# Patient Record
Sex: Female | Born: 1985 | Race: Black or African American | Hispanic: No | Marital: Single | Smoking: Never smoker
Health system: Southern US, Community
[De-identification: ages and names within clinical notes are randomized; demographics above are authoritative.]

## PROBLEM LIST (undated history)

## (undated) DIAGNOSIS — D219 Benign neoplasm of connective and other soft tissue, unspecified: Secondary | ICD-10-CM

## (undated) DIAGNOSIS — R87629 Unspecified abnormal cytological findings in specimens from vagina: Secondary | ICD-10-CM

## (undated) DIAGNOSIS — D649 Anemia, unspecified: Secondary | ICD-10-CM

## (undated) HISTORY — PX: NO PAST SURGERIES: SHX2092

---

## 2008-01-10 ENCOUNTER — Ambulatory Visit (HOSPITAL_COMMUNITY): Admission: RE | Admit: 2008-01-10 | Discharge: 2008-01-10 | Payer: Self-pay | Admitting: Obstetrics

## 2008-02-12 ENCOUNTER — Ambulatory Visit (HOSPITAL_COMMUNITY): Admission: RE | Admit: 2008-02-12 | Discharge: 2008-02-12 | Payer: Self-pay | Admitting: Obstetrics

## 2008-04-08 ENCOUNTER — Ambulatory Visit (HOSPITAL_COMMUNITY): Admission: RE | Admit: 2008-04-08 | Discharge: 2008-04-08 | Payer: Self-pay | Admitting: Obstetrics

## 2008-05-01 ENCOUNTER — Inpatient Hospital Stay (HOSPITAL_COMMUNITY): Admission: AD | Admit: 2008-05-01 | Discharge: 2008-05-01 | Payer: Self-pay | Admitting: Obstetrics

## 2008-05-06 ENCOUNTER — Ambulatory Visit (HOSPITAL_COMMUNITY): Admission: RE | Admit: 2008-05-06 | Discharge: 2008-05-06 | Payer: Self-pay | Admitting: Obstetrics

## 2008-05-13 ENCOUNTER — Inpatient Hospital Stay (HOSPITAL_COMMUNITY): Admission: AD | Admit: 2008-05-13 | Discharge: 2008-05-15 | Payer: Self-pay | Admitting: Obstetrics

## 2008-05-30 ENCOUNTER — Inpatient Hospital Stay (HOSPITAL_COMMUNITY): Admission: AD | Admit: 2008-05-30 | Discharge: 2008-06-02 | Payer: Self-pay | Admitting: Obstetrics

## 2009-03-22 IMAGING — US US OB FOLLOW-UP
1 series · 14 of 27 positions shown · non-contrast
Comparison: none

OBSTETRICAL ULTRASOUND:
 This ultrasound was performed in The [HOSPITAL], and the [facility] report will be stored to [REDACTED] PACS.

[Series 1: us ob follow-up · 14 of 27 slices shown]
[im 1/27]
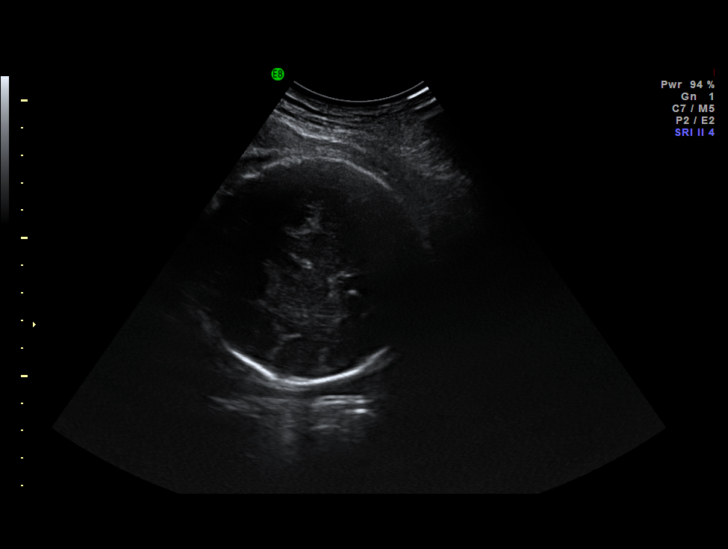
[im 3/27]
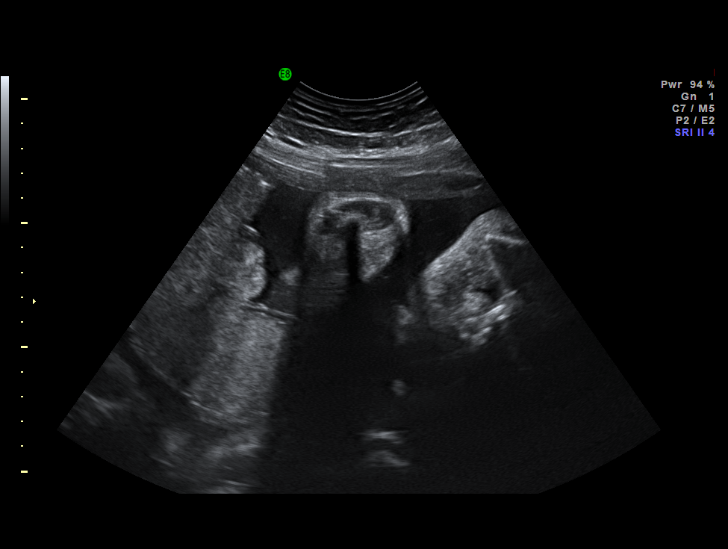
[im 5/27]
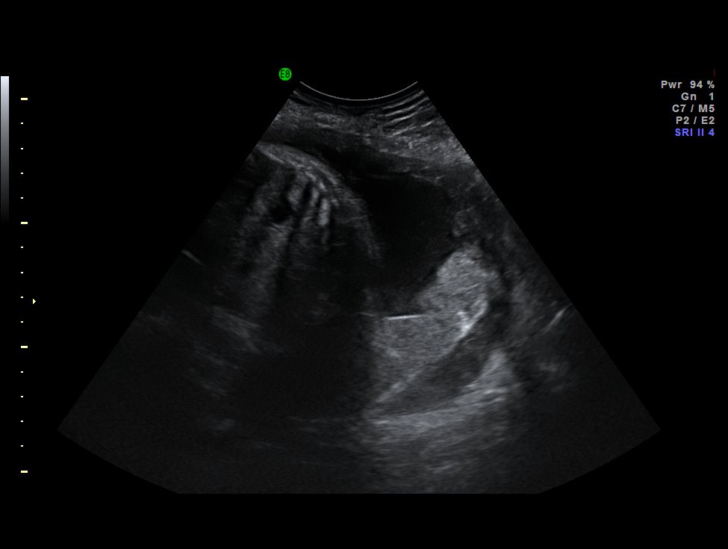
[im 7/27]
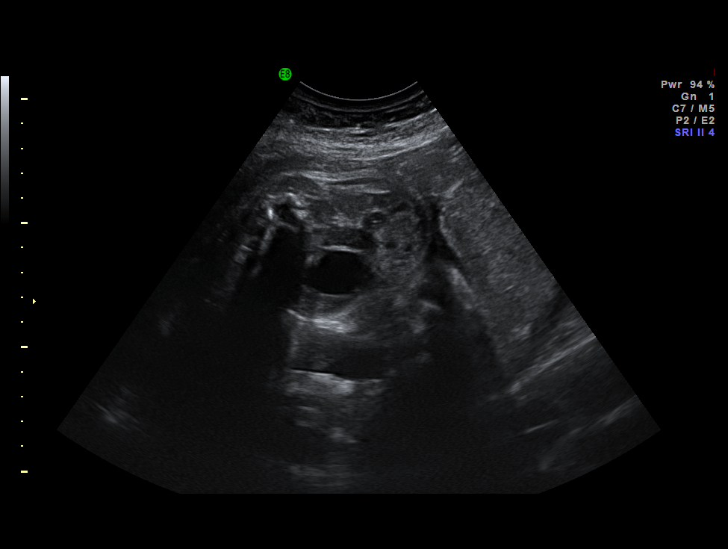
[im 9/27]
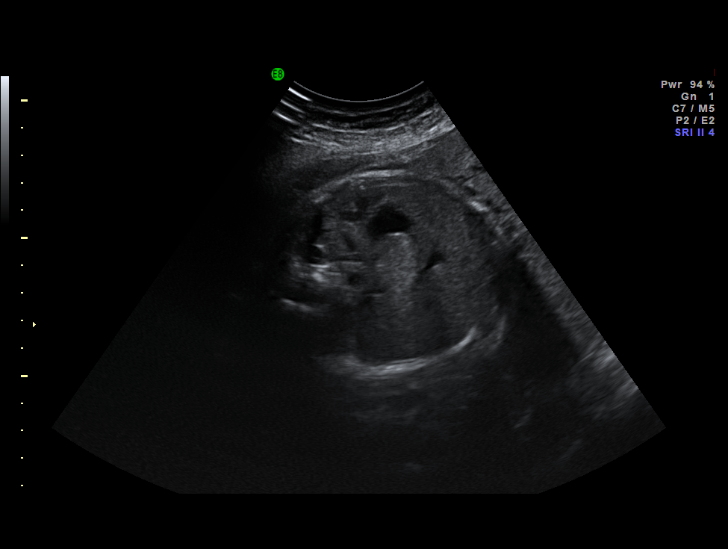
[im 11/27]
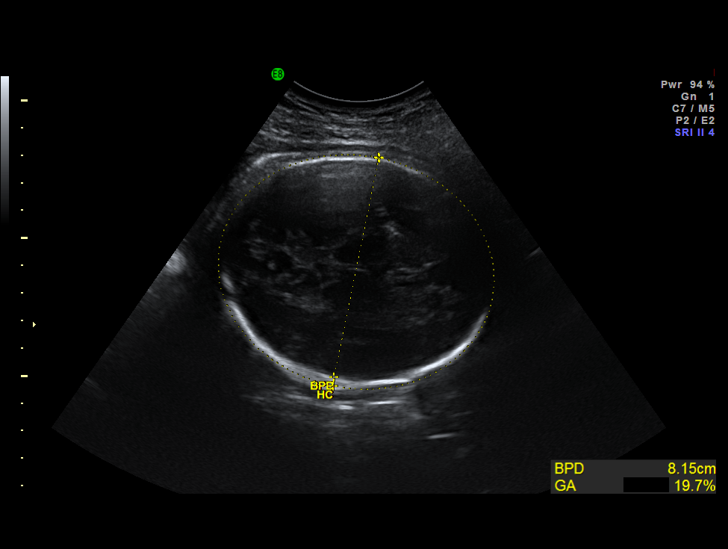
[im 13/27]
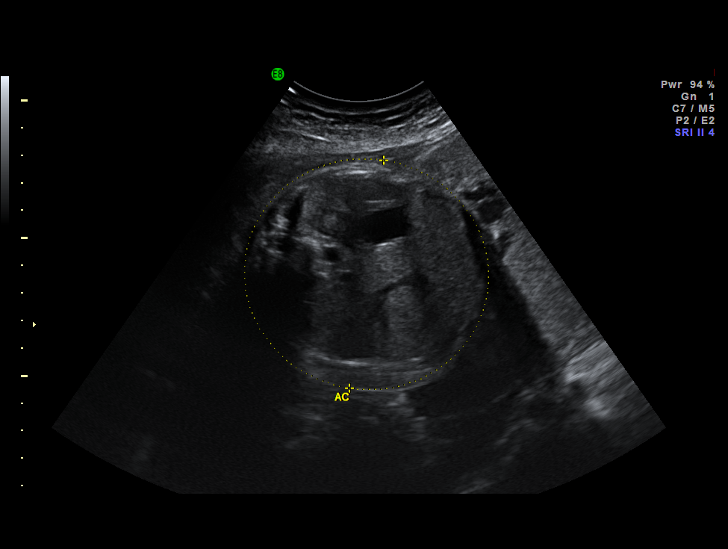
[im 15/27]
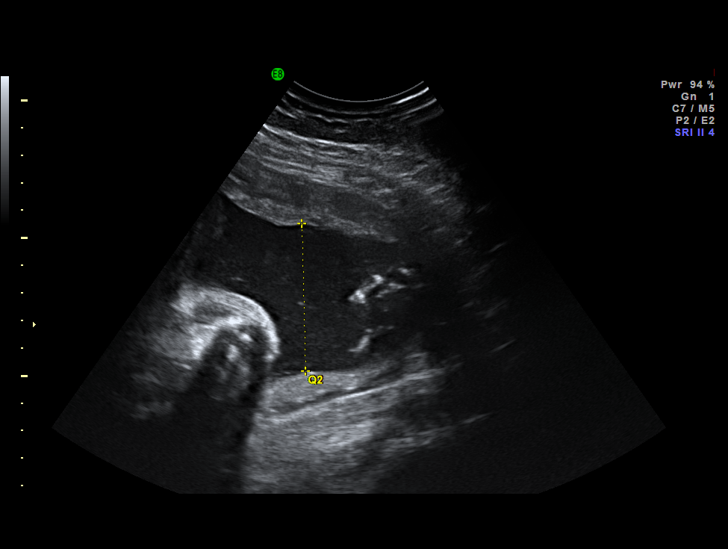
[im 17/27]
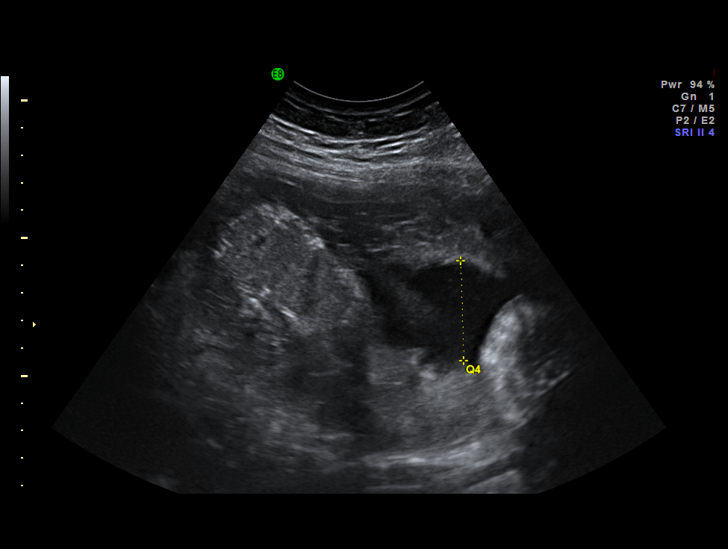
[im 19/27]
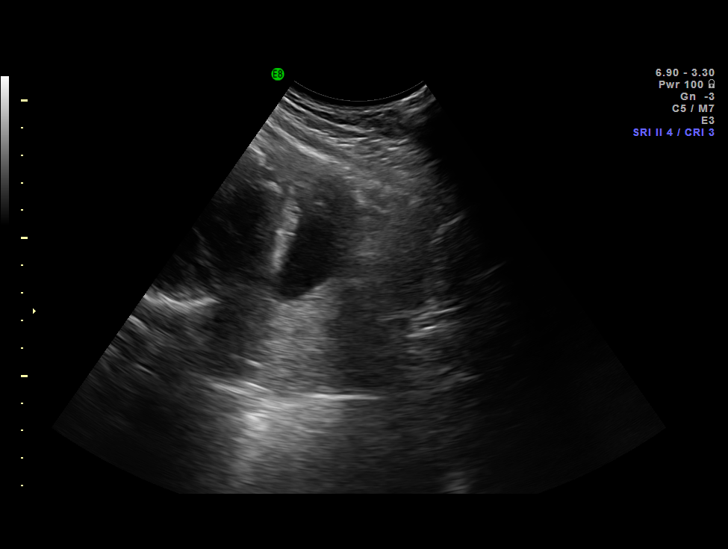
[im 21/27]
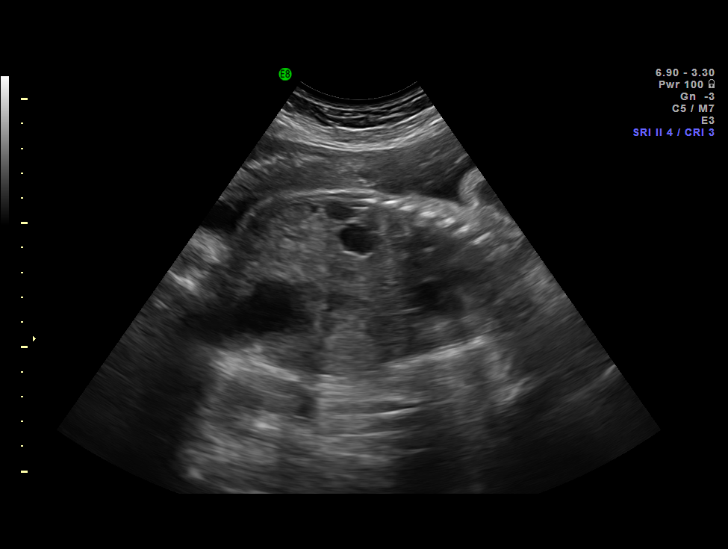
[im 23/27]
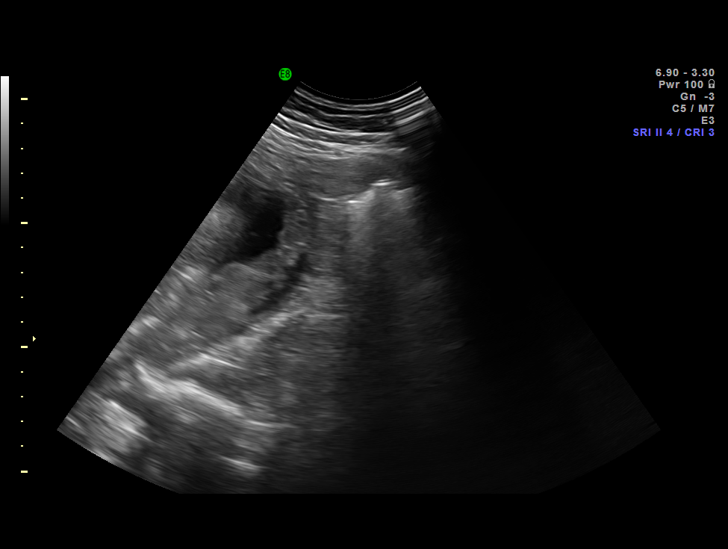
[im 25/27]
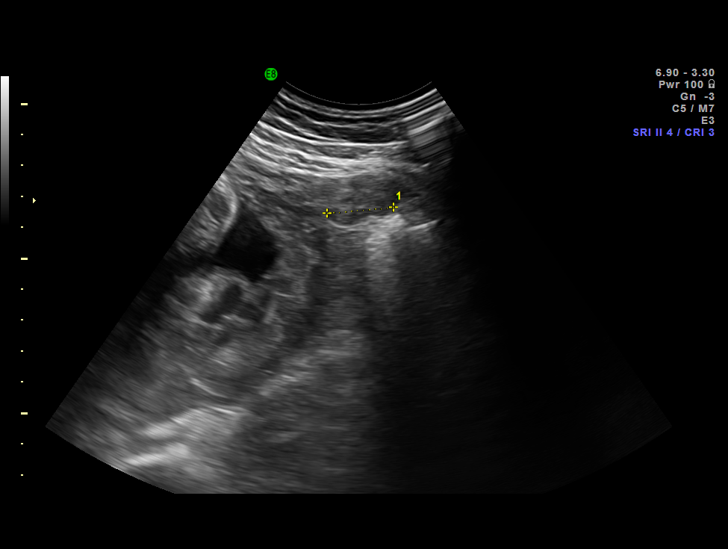
[im 27/27]
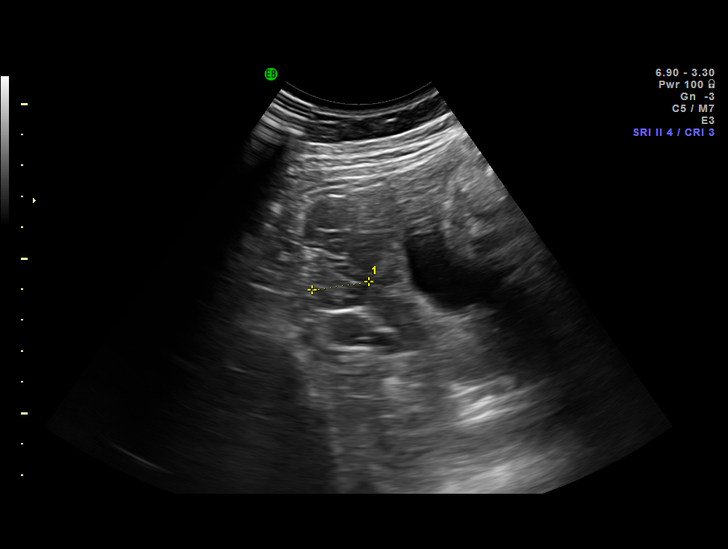

[14 of 27 positions shown; findings below may reference images not displayed]

IMPRESSION: [facility] has also been faxed to the ordering physician.

## 2009-04-04 ENCOUNTER — Emergency Department (HOSPITAL_COMMUNITY): Admission: EM | Admit: 2009-04-04 | Discharge: 2009-04-05 | Payer: Self-pay | Admitting: Emergency Medicine

## 2009-04-19 IMAGING — US US OB FOLLOW-UP
1 series · 14 of 18 positions shown · non-contrast
Comparison: none

OBSTETRICAL ULTRASOUND:
 This ultrasound was performed in The [HOSPITAL], and the [facility] report will be stored to [REDACTED] PACS.

[Series 1: us ob follow-up · 14 of 18 slices shown]
[im 1/18]
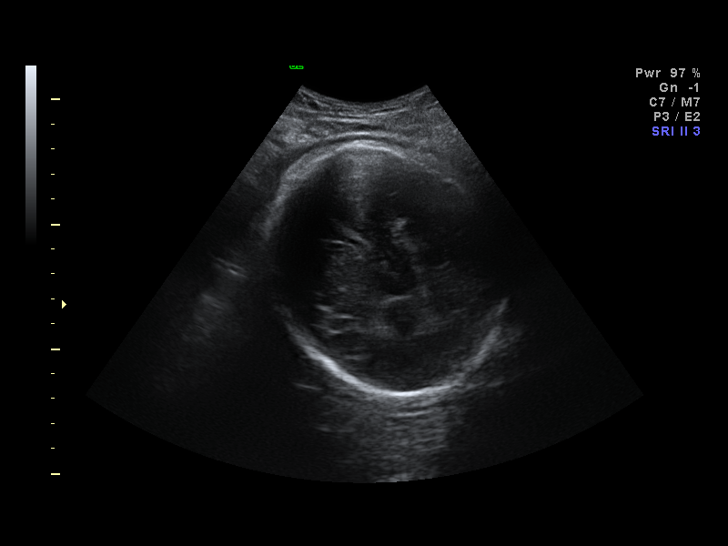
[im 2/18]
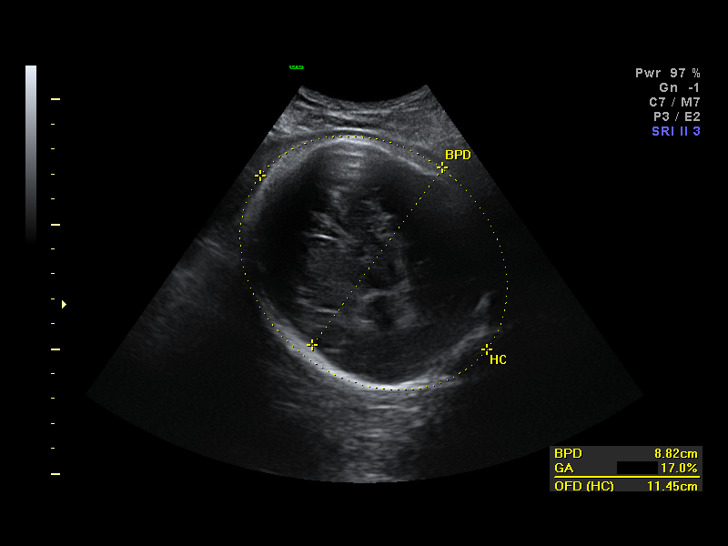
[im 4/18]
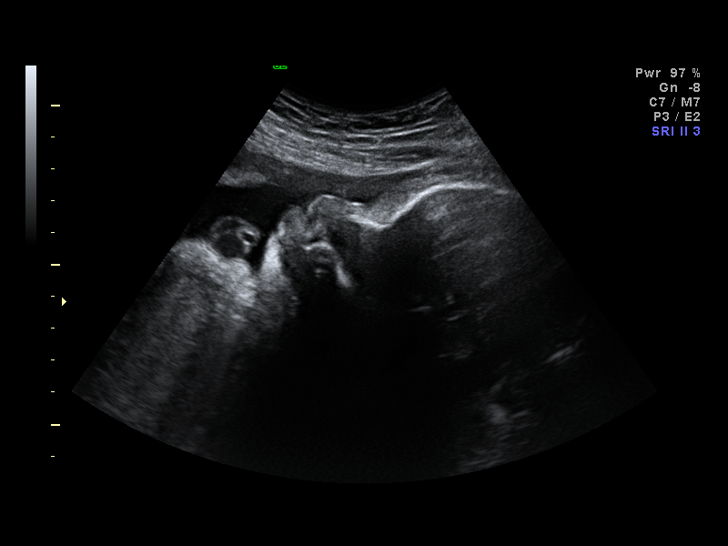
[im 5/18]
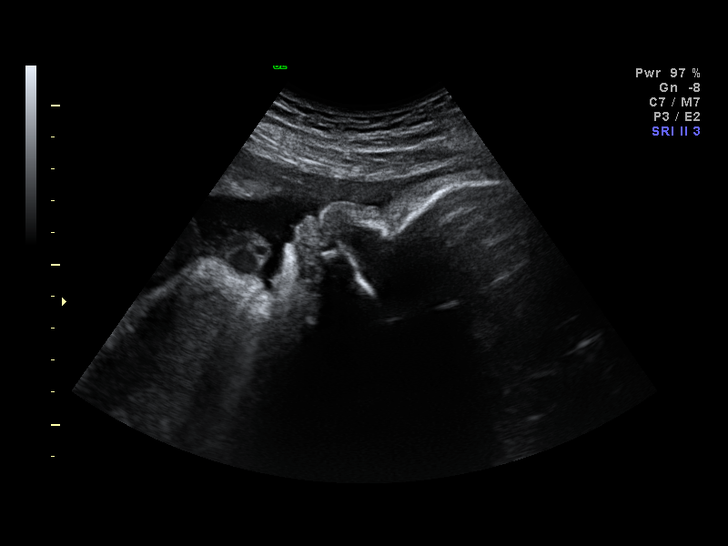
[im 6/18]
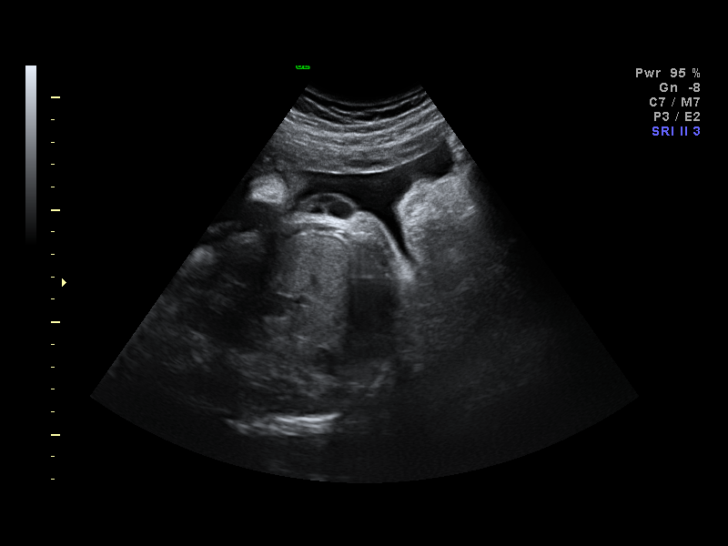
[im 8/18]
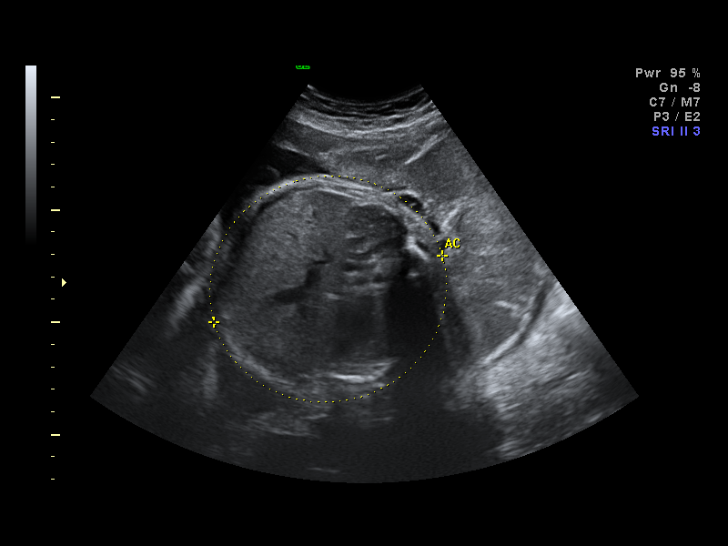
[im 9/18]
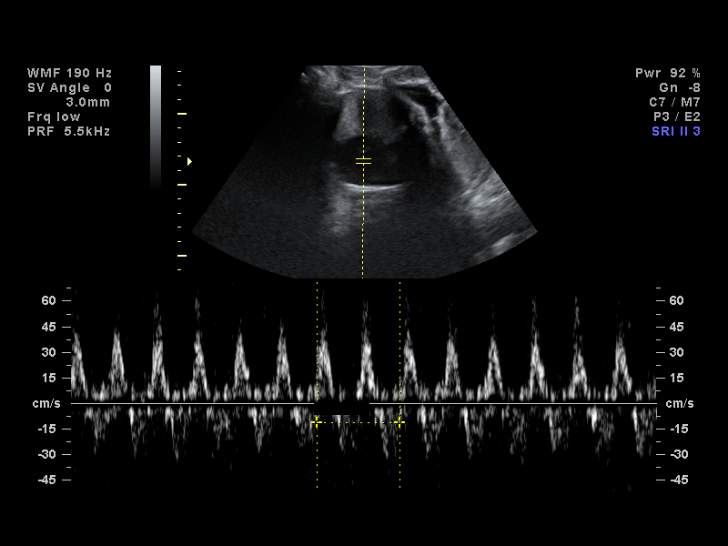
[im 10/18]
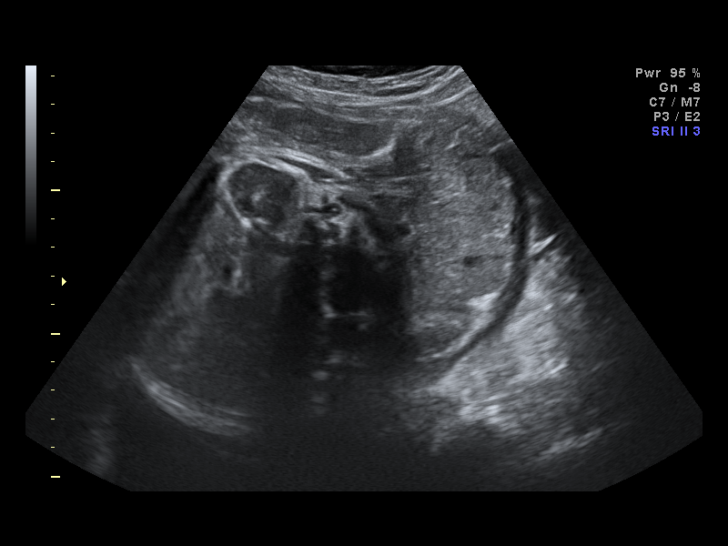
[im 11/18]
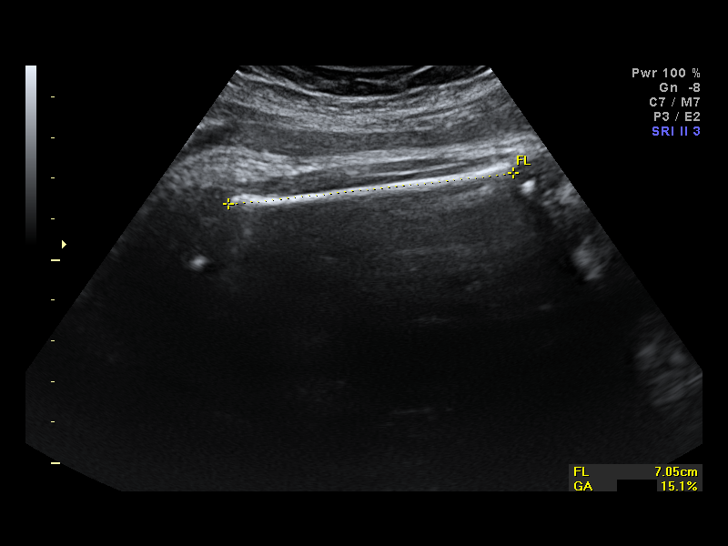
[im 13/18]
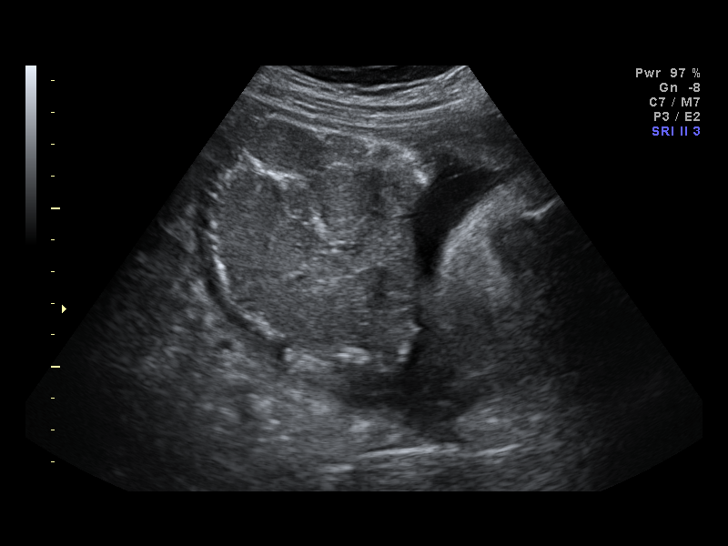
[im 14/18]
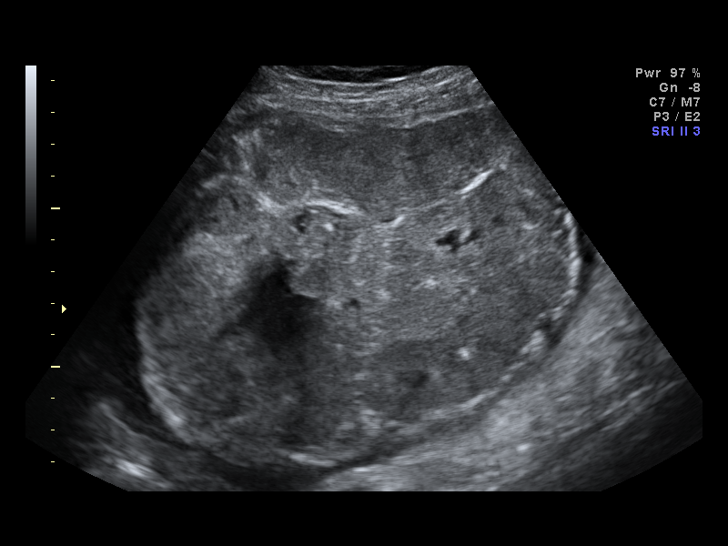
[im 15/18]
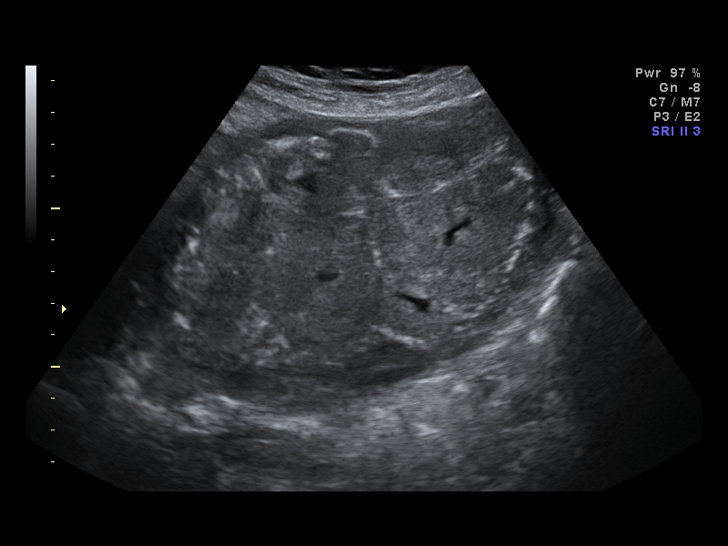
[im 17/18]
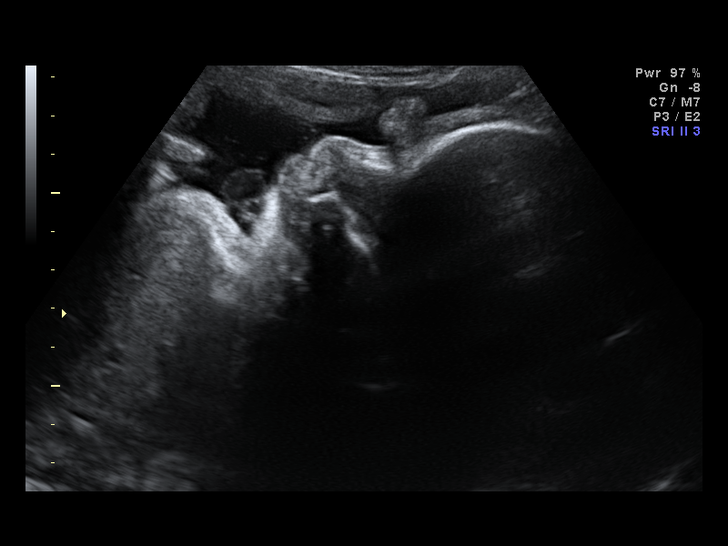
[im 18/18]
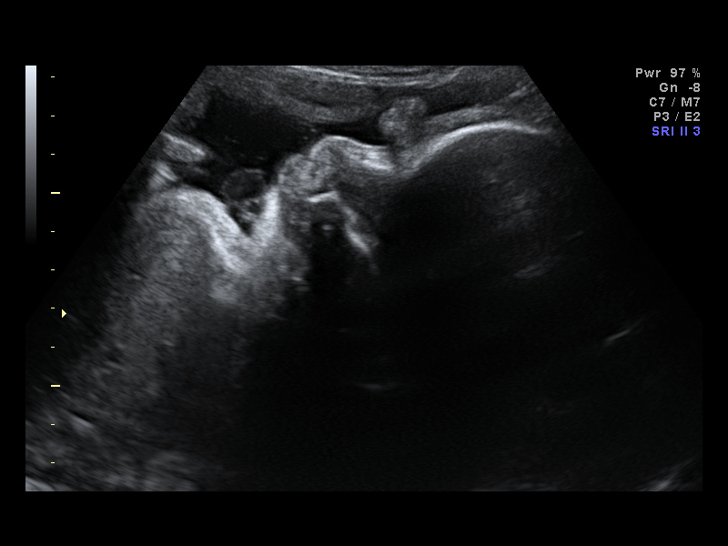

[14 of 18 positions shown; findings below may reference images not displayed]

IMPRESSION: [facility] has also been faxed to the ordering physician.

## 2009-05-14 IMAGING — US US RENAL
1 series · 14 of 25 positions shown · non-contrast
Comparison: None

CLINICAL DATA: Fever, chills, back pain.  Evaluate for
urolithiasis.

RENAL/URINARY TRACT ULTRASOUND
TECHNIQUE: Complete ultrasound examination of the urinary tract
was performed including evaluation of the kidneys renal collecting
systems and urinary bladder.

[Series 1: us renal · 0.28mm/px · 14 of 27 slices shown]
[im 1/27]
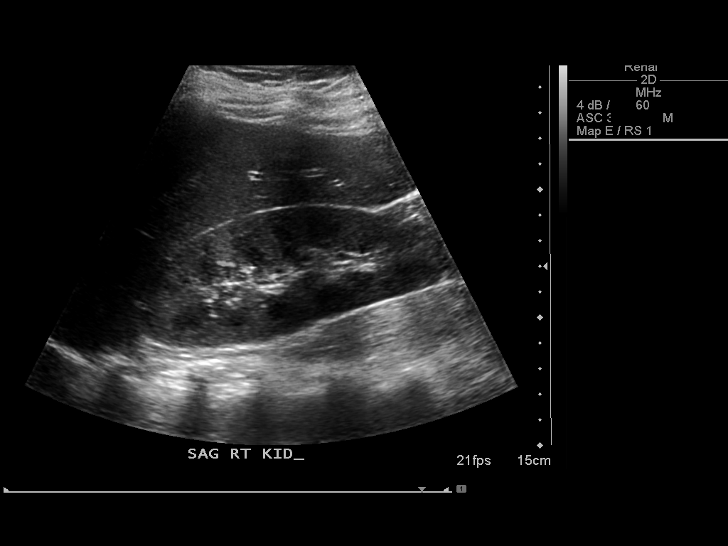
[im 3/27]
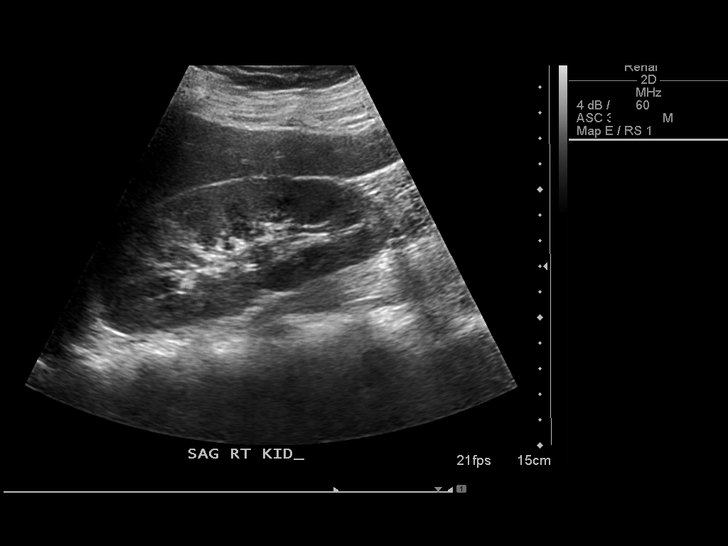
[im 5/27]
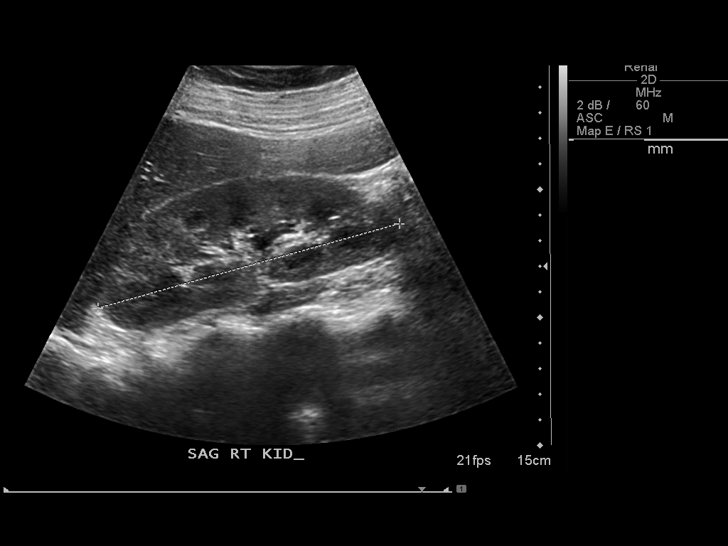
[im 7/27]
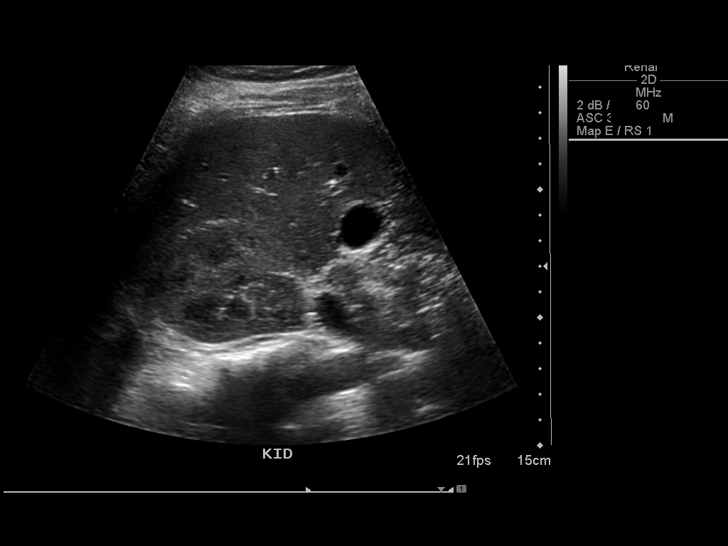
[im 9/27]
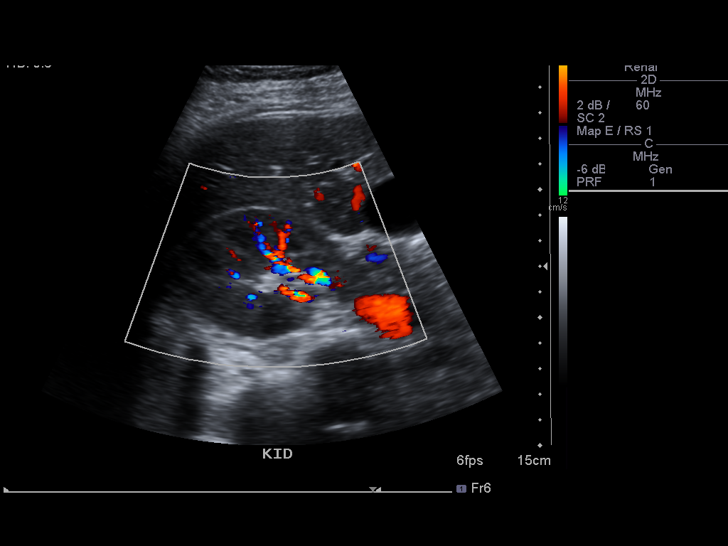
[im 10/27]
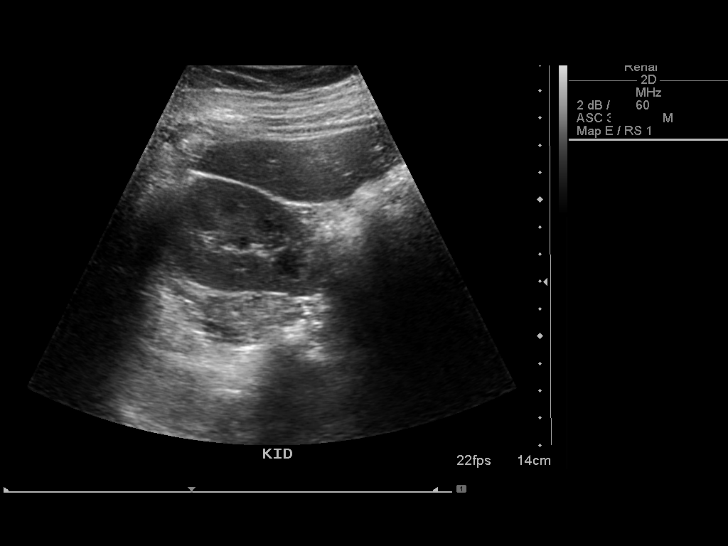
[im 12/27]
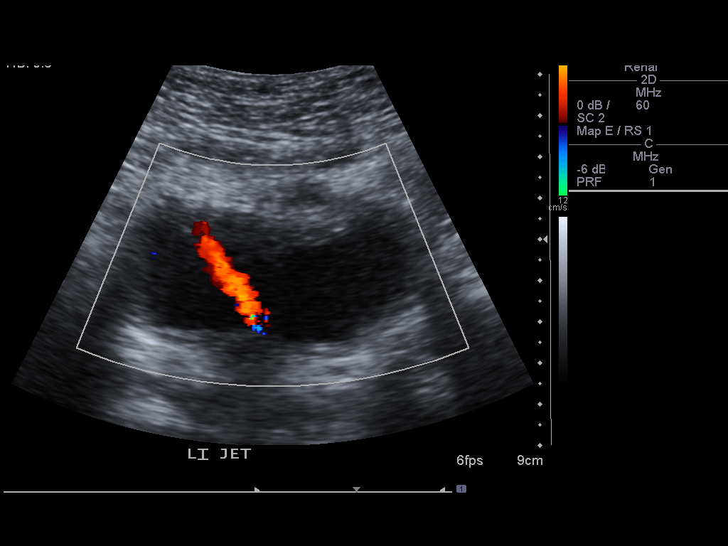
[im 15/27]
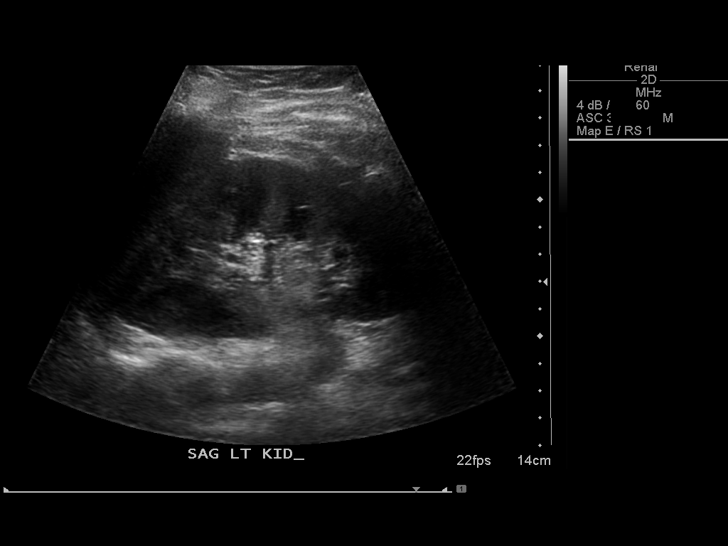
[im 17/27]
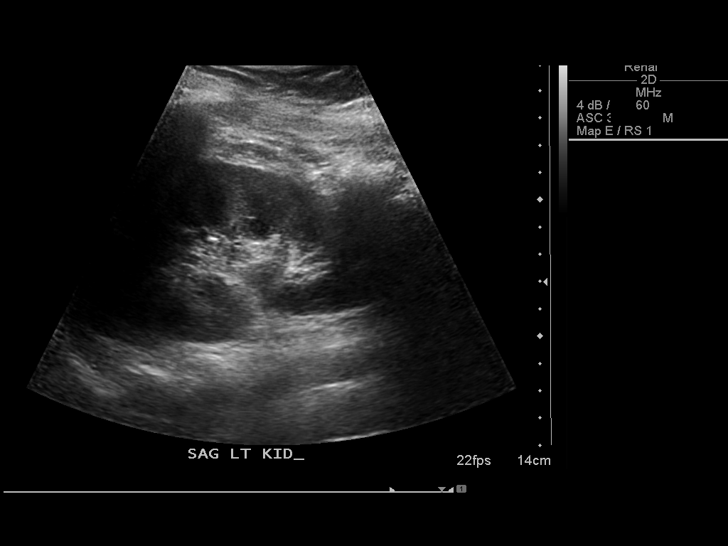
[im 18/27]
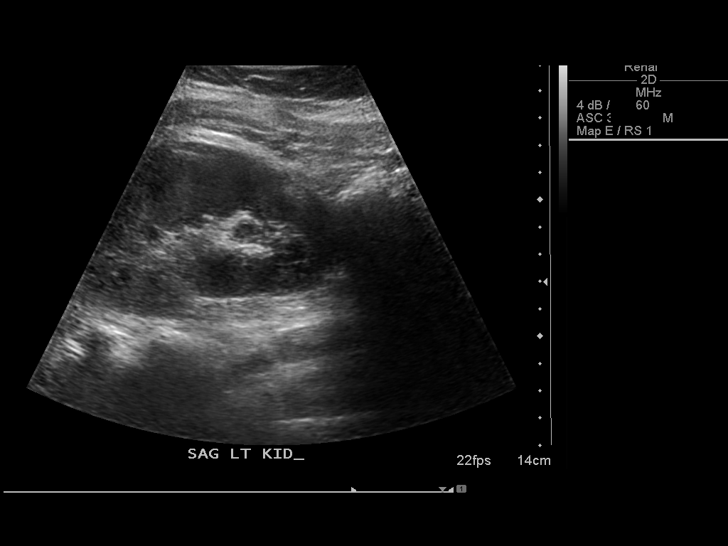
[im 20/27]
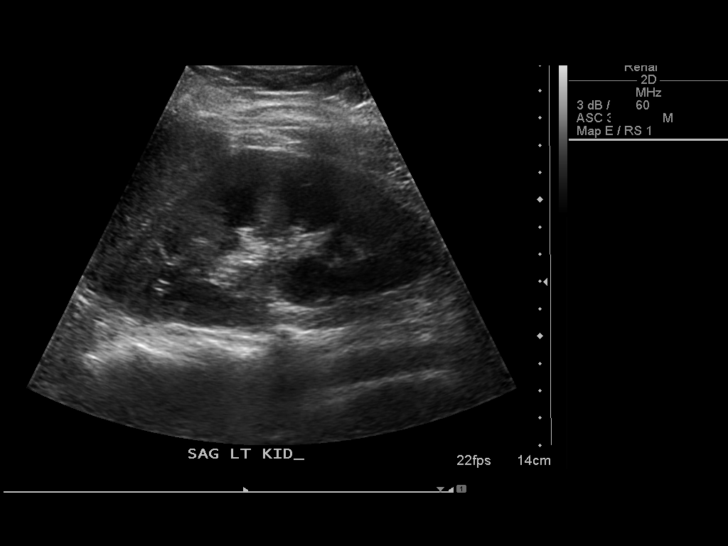
[im 22/27]
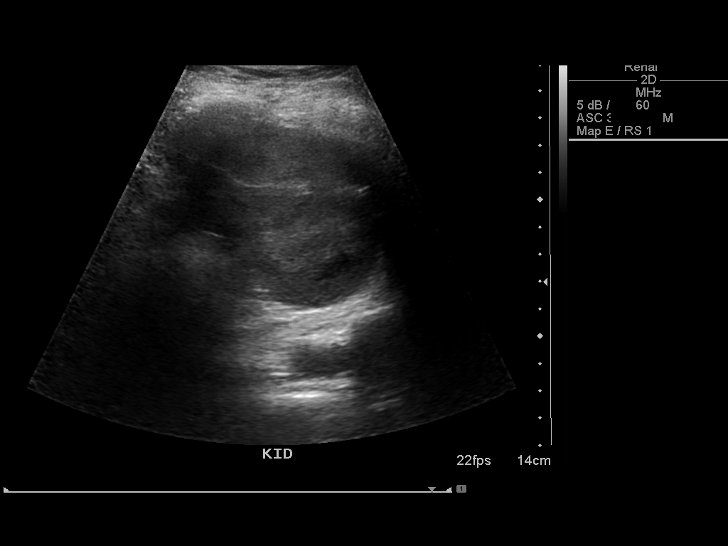
[im 24/27]
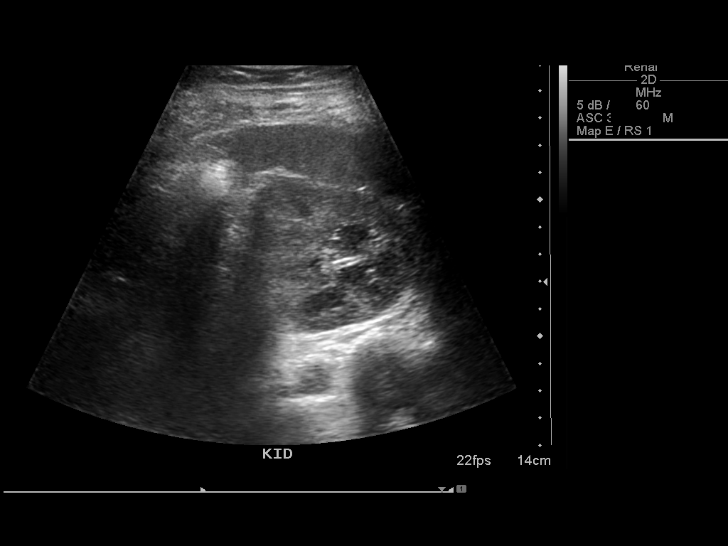
[im 27/27]
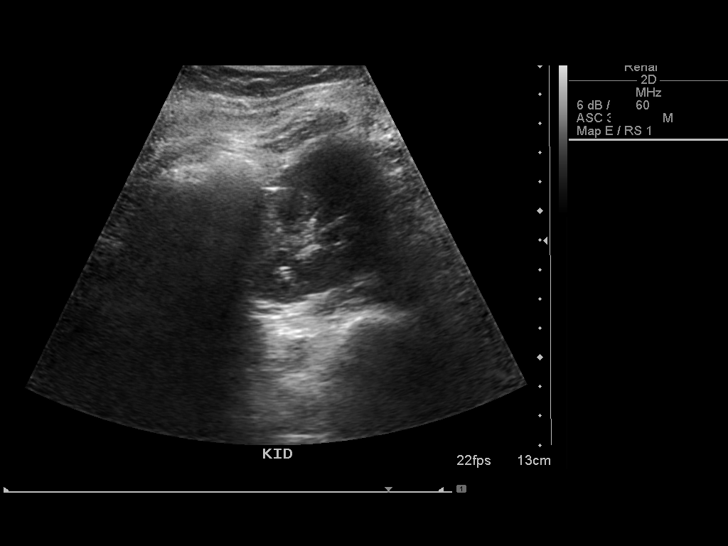

[14 of 25 positions shown; findings below may reference images not displayed]

FINDINGS: Kidneys measure 12.3 cm bilaterally.  Parenchymal echo
texture is uniform without mass, stone or hydronephrosis.  Ureteral
jets are seen in the bladder.  No hydronephrosis.
IMPRESSION: No acute findings.

## 2010-04-29 ENCOUNTER — Emergency Department (HOSPITAL_COMMUNITY): Admission: EM | Admit: 2010-04-29 | Discharge: 2010-04-29 | Payer: Self-pay | Admitting: Emergency Medicine

## 2011-03-15 LAB — POCT URINALYSIS DIP (DEVICE)
Glucose, UA: NEGATIVE mg/dL
Ketones, ur: NEGATIVE mg/dL
Nitrite: NEGATIVE
Protein, ur: NEGATIVE mg/dL
Specific Gravity, Urine: 1.025 (ref 1.005–1.030)

## 2011-03-15 LAB — URINE CULTURE: Colony Count: NO GROWTH

## 2011-03-15 LAB — POCT PREGNANCY, URINE: Preg Test, Ur: NEGATIVE

## 2011-04-12 IMAGING — CR DG CHEST 2V
2 series · 2 of 2 positions shown · non-contrast
Comparison: None.

CLINICAL DATA: Shortness of breath.

CHEST - 2 VIEW

[view not recorded (1 of 2)]
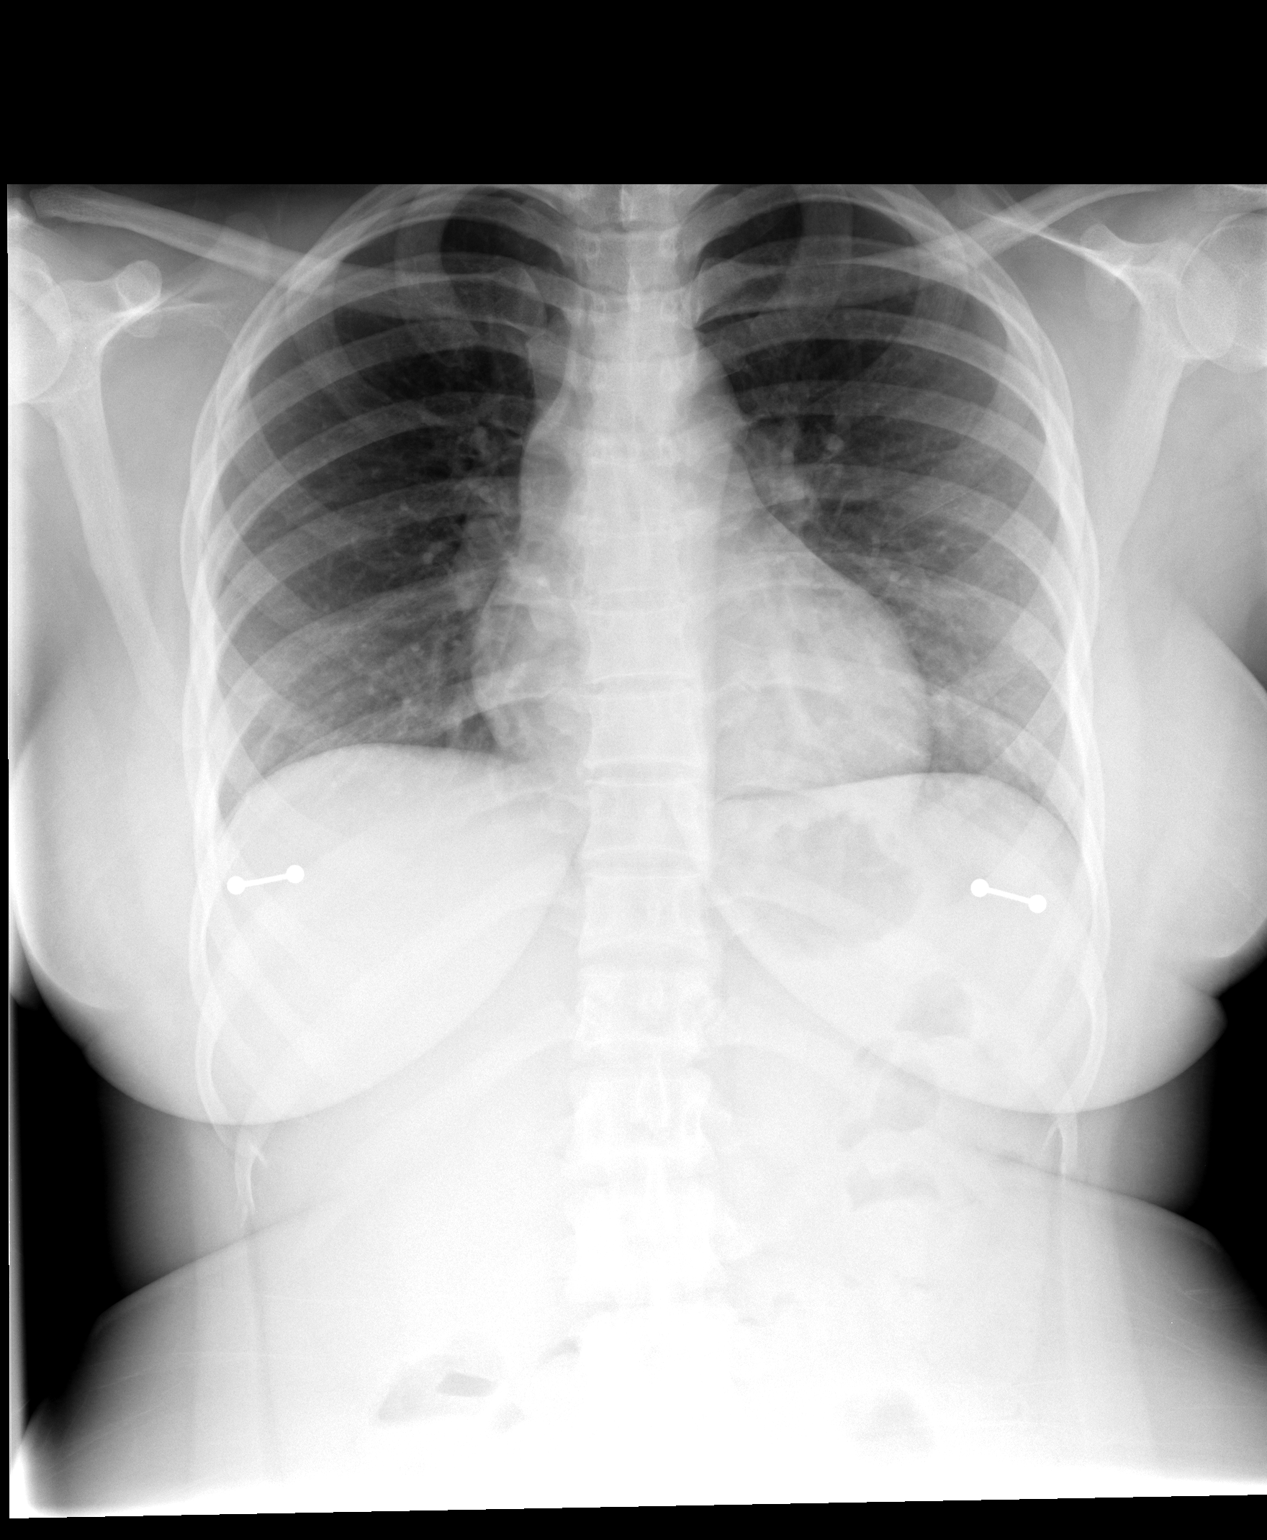

[view not recorded (2 of 2)]
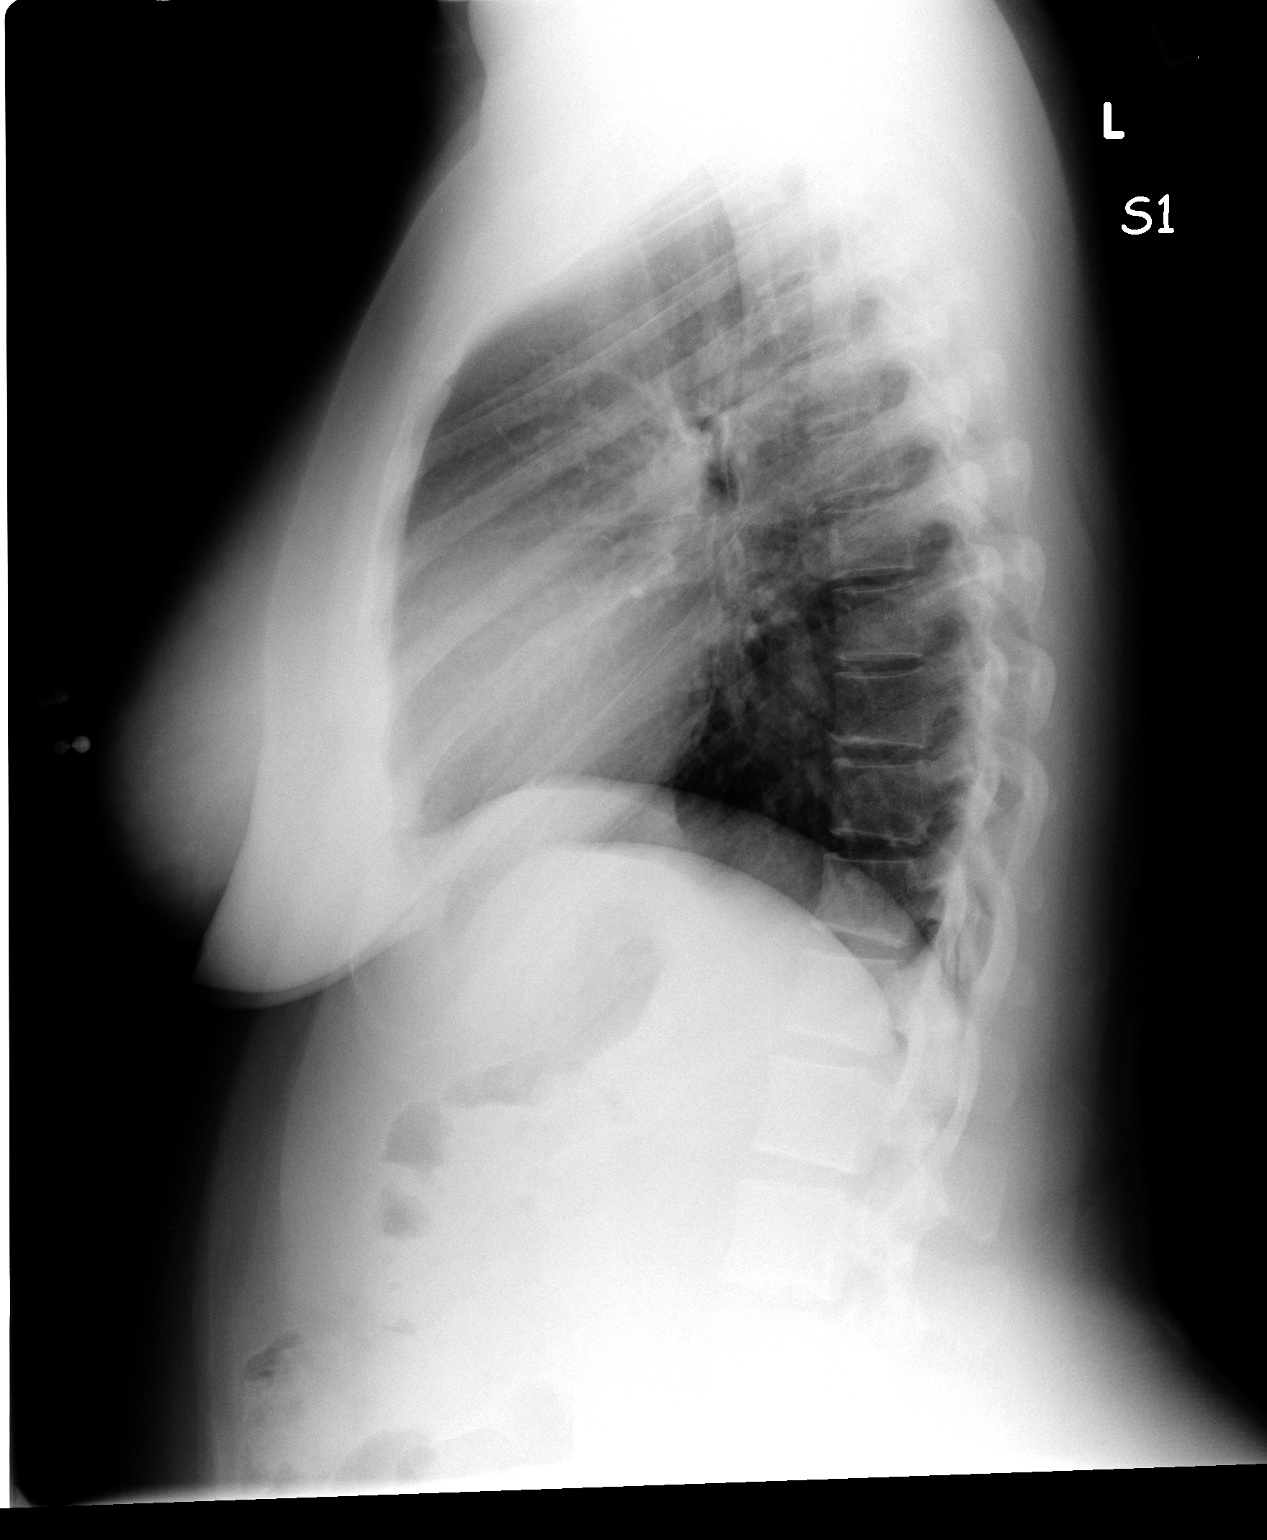

[2 of 2 positions shown; findings below may reference images not displayed]

FINDINGS: Lungs clear.  Heart size normal.  No pleural effusion or
focal bony abnormality.
IMPRESSION: Normal chest.

## 2011-05-13 NOTE — Discharge Summary (Signed)
Kristi Kirby, Kristi Kirby NO.:  [id number]   MEDICAL RECORD NO.:  [id number]          PATIENT TYPE:  INP   LOCATION:  9312                          FACILITY:  [facility]   PHYSICIAN:  Kathreen Cosier, M.D.DATE OF BIRTH:  03-Apr-1986   DATE OF ADMISSION:  05/30/2008  DATE OF DISCHARGE:  06/02/2008                               DISCHARGE SUMMARY   The patient is a 25 year old female who complained of CVA tenderness and  temperature of 102.  She had a vaginal delivery on May 13, 2008, and she  had chills.  She took Motrin and the chills went away, but however, the  chills came back.  On admission, she had, like I said, temperature of  102 and she was started on ampicillin IV and gentamicin and she  responded appropriately.  By June 02, 2008, she was 2 days afebrile and  her CVA tenderness had resolved.  She was discharged home on ampicillin  500 p.o. q.6 h. for 5 days and Yaz for contraception.   DISCHARGE DIAGNOSIS:  Status post admission for pyelonephritis.           ______________________________  Kathreen Cosier, M.D.     BAM/MEDQ  D:  06/18/2008  T:  06/19/2008  Job:  [id number]

## 2011-09-07 ENCOUNTER — Inpatient Hospital Stay (INDEPENDENT_AMBULATORY_CARE_PROVIDER_SITE_OTHER)
Admission: RE | Admit: 2011-09-07 | Discharge: 2011-09-07 | Disposition: A | Discharge status: Home / Self Care | Payer: Self-pay | Source: Ambulatory Visit | Attending: Family Medicine | Admitting: Family Medicine

## 2011-09-07 DIAGNOSIS — L259 Unspecified contact dermatitis, unspecified cause: Secondary | ICD-10-CM

## 2011-09-21 LAB — CBC
HCT: 35.5 — ABNORMAL LOW
HCT: 38.5
Hemoglobin: 12.2
MCV: 94.7
MCV: 95.1
Platelets: 229
WBC: 15.6 — ABNORMAL HIGH

## 2011-09-22 LAB — URINALYSIS, ROUTINE W REFLEX MICROSCOPIC
Nitrite: NEGATIVE
Specific Gravity, Urine: 1.01
Urobilinogen, UA: 0.2
pH: 6

## 2011-09-22 LAB — CULTURE, BLOOD (ROUTINE X 2)

## 2011-09-22 LAB — CBC
Hemoglobin: 13.5
MCHC: 34.6
RBC: 4.12
RDW: 14

## 2011-09-22 LAB — DIFFERENTIAL
Basophils Absolute: 0
Eosinophils Absolute: 0
Lymphocytes Relative: 8 — ABNORMAL LOW
Lymphs Abs: 0.9
Monocytes Absolute: 0.6
Monocytes Relative: 5
Neutro Abs: 10.6 — ABNORMAL HIGH
Neutrophils Relative %: 87 — ABNORMAL HIGH

## 2011-09-22 LAB — URINE MICROSCOPIC-ADD ON

## 2011-09-22 LAB — URINE CULTURE

## 2011-09-22 LAB — CREATININE, SERUM
GFR calc Af Amer: >60
GFR calc non Af Amer: >60

## 2014-01-01 ENCOUNTER — Emergency Department (INDEPENDENT_AMBULATORY_CARE_PROVIDER_SITE_OTHER)
Admission: EM | Admit: 2014-01-01 | Discharge: 2014-01-01 | Disposition: A | Discharge status: Home / Self Care | Payer: Self-pay | Source: Home / Self Care | Attending: Emergency Medicine | Admitting: Emergency Medicine

## 2014-01-01 ENCOUNTER — Encounter (HOSPITAL_COMMUNITY): Payer: Self-pay | Admitting: Emergency Medicine

## 2014-01-01 DIAGNOSIS — R69 Illness, unspecified: Principal | ICD-10-CM

## 2014-01-01 DIAGNOSIS — J111 Influenza due to unidentified influenza virus with other respiratory manifestations: Secondary | ICD-10-CM

## 2014-01-01 MED ORDER — IBUPROFEN 800 MG PO TABS
800.0000 mg | ORAL_TABLET | Freq: Once | ORAL | Status: AC
  Administered 2014-01-01: 800 mg via ORAL

## 2014-01-01 MED ORDER — OSELTAMIVIR PHOSPHATE 75 MG PO CAPS: 75.0000 mg | ORAL_CAPSULE | Freq: Two times a day (BID) | ORAL | Status: DC

## 2014-01-01 NOTE — ED Notes (Signed)
Pt  Has  Symptoms  Of  Body  Aches   Chills  Headache      With  Sinus  Pressure     /  Drainage   X    2  Days    She  Is  Sitting upright  On  Exam table  Speaking in  Complete  sentances             No  Antipyretics  Given today

## 2014-01-01 NOTE — ED Provider Notes (Signed)
CSN: [id number]     Arrival date & time 01/01/14  1506 History   First MD Initiated Contact with Patient 01/01/14 1606     Chief Complaint  Patient presents with  . Fever   (Consider location/radiation/quality/duration/timing/severity/associated sxs/prior Treatment) Patient is a 28 y.o. female presenting with flu symptoms.  Influenza Presenting symptoms: cough, diarrhea, fatigue, fever, headache, myalgias and sore throat   Presenting symptoms: no shortness of breath   Severity:  Moderate Onset quality:  Sudden Duration:  1 day Progression:  Worsening Chronicity:  New Associated symptoms: chills and nasal congestion   Associated symptoms: no ear pain, no neck stiffness and no witnessed syncope     History reviewed. No pertinent past medical history. History reviewed. No pertinent past surgical history. History reviewed. No pertinent family history. History  Substance Use Topics  . Smoking status: Never Smoker   . Smokeless tobacco: Not on file  . Alcohol Use: Yes   OB History   Grav Para Term Preterm Abortions TAB SAB Ect Mult Living                 Review of Systems  Constitutional: Positive for fever, chills and fatigue.  HENT: Positive for congestion and sore throat. Negative for ear pain.   Eyes: Negative.   Respiratory: Positive for cough. Negative for shortness of breath.   Cardiovascular: Negative.   Gastrointestinal: Positive for diarrhea.  Endocrine: Negative.   Genitourinary: Negative.   Musculoskeletal: Positive for myalgias. Negative for neck stiffness.  Skin: Negative.   Allergic/Immunologic: Negative for immunocompromised state.  Neurological: Positive for headaches.  Hematological: Negative for adenopathy.  Psychiatric/Behavioral: Negative for behavioral problems.    Allergies  Review of patient's allergies indicates no known allergies.  Home Medications   Current Outpatient Rx  Name  Route  Sig  Dispense  Refill  . Chlorphen-Pseudoephed-APAP  (THERAFLU FLU/COLD PO)   Oral   Take by mouth.          BP 114/67  Pulse 102  Temp(Src) 103 F (39.4 C) (Oral)  Resp 22  SpO2 100%  LMP 12/26/2013 Physical Exam  Nursing note and vitals reviewed. Constitutional: She is oriented to person, place, and time. She appears well-developed and well-nourished. No distress.  HENT:  Head: Normocephalic and atraumatic.  Eyes: Right eye exhibits no discharge. Left eye exhibits no discharge. Right conjunctiva is not injected. Left conjunctiva is not injected.  Neck: Normal range of motion and full passive range of motion without pain. Neck supple.  Cardiovascular: Normal rate, regular rhythm and normal heart sounds.   Pulmonary/Chest: Effort normal and breath sounds normal.  Abdominal: Soft. Bowel sounds are normal. She exhibits no distension. There is no tenderness.  Musculoskeletal: Normal range of motion.  Lymphadenopathy:    She has no cervical adenopathy.  Neurological: She is alert and oriented to person, place, and time.  Skin: Skin is warm and dry. No rash noted. No pallor.  Psychiatric: She has a normal mood and affect. Her behavior is normal.    ED Course  Procedures (including critical care time) Labs Review Labs Reviewed - No data to display Imaging Review No results found.  EKG Interpretation    Date/Time:    Ventricular Rate:    PR Interval:    QRS Duration:   QT Interval:    QTC Calculation:   R Axis:     Text Interpretation:              MDM  Influenza-like illness in  otherwise healthy non-smoking 28 y/o female who did not take 2014 flu shot. Will treat with Tamiflu along with symptomatic care at home.     [place], [place] 01/01/14 [phone]

## 2014-01-01 NOTE — ED Provider Notes (Signed)
Medical screening examination/treatment/procedure(s) were performed by non-physician practitioner and as supervising physician I was immediately available for consultation/collaboration.  Klohe Lovering, M.D.  Maykayla Highley C Marchella Hibbard, MD 01/01/14 2235 

## 2016-06-07 ENCOUNTER — Inpatient Hospital Stay (HOSPITAL_COMMUNITY)
Admission: AD | Admit: 2016-06-07 | Discharge: 2016-06-07 | Disposition: A | Discharge status: Home / Self Care | Payer: Self-pay | Source: Ambulatory Visit | Attending: Obstetrics and Gynecology | Admitting: Obstetrics and Gynecology

## 2016-06-07 ENCOUNTER — Encounter (HOSPITAL_COMMUNITY): Payer: Self-pay | Admitting: *Deleted

## 2016-06-07 DIAGNOSIS — D649 Anemia, unspecified: Secondary | ICD-10-CM | POA: Insufficient documentation

## 2016-06-07 DIAGNOSIS — R1032 Left lower quadrant pain: Secondary | ICD-10-CM | POA: Insufficient documentation

## 2016-06-07 DIAGNOSIS — D259 Leiomyoma of uterus, unspecified: Secondary | ICD-10-CM | POA: Insufficient documentation

## 2016-06-07 HISTORY — DX: Unspecified abnormal cytological findings in specimens from vagina: R87.629

## 2016-06-07 HISTORY — DX: Benign neoplasm of connective and other soft tissue, unspecified: D21.9

## 2016-06-07 LAB — CBC
HCT: 29.4 % — ABNORMAL LOW (ref 36.0–46.0)
Hemoglobin: 9.8 g/dL — ABNORMAL LOW (ref 12.0–15.0)
MCH: 30.1 pg (ref 26.0–34.0)
MCHC: 33.3 g/dL (ref 30.0–36.0)
MCV: 90.2 fL (ref 78.0–100.0)
PLATELETS: 536 10*3/uL — AB (ref 150–400)
RBC: 3.26 MIL/uL — ABNORMAL LOW (ref 3.87–5.11)
RDW: 13.2 % (ref 11.5–15.5)
WBC: 12.6 10*3/uL — AB (ref 4.0–10.5)

## 2016-06-07 MED ORDER — KETOROLAC TROMETHAMINE 60 MG/2ML IM SOLN
60.0000 mg | Freq: Once | INTRAMUSCULAR | Status: AC
  Administered 2016-06-07: 60 mg via INTRAMUSCULAR
  Filled 2016-06-07: qty 2

## 2016-06-07 MED ORDER — NAPROXEN 500 MG PO TABS: 500.0000 mg | ORAL_TABLET | Freq: Two times a day (BID) | ORAL | Status: DC

## 2016-06-07 NOTE — MAU Note (Signed)
Was dx with fibroids 9 yrs ago when pregnant.  Pain has been getting worse

## 2016-06-07 NOTE — MAU Provider Note (Signed)
History     CSN: [id number]  Arrival date and time: 06/07/16 [id number]   First Provider Initiated Contact with Patient 06/07/16 [phone]      Chief Complaint  Patient presents with  . Abdominal Pain   HPI  Kristi Kirby is a 30 y.o. [id number] female who presents for abdominal pain. Went to [facility] yesterday for pain; had ultrasound & was told that she has uterine fibroids and would referred to [facility]. LMP 5/30 -- lasted 7 days & was heavier than normal. Reports lower abdominal pain since than that has worsened since her ultrasound yesterday. Rates pain 8/10 & describes as constant sharp/pulling sensation that is worse in her LLQ. Has not treated pain today. Denies alleviating or aggravating factors. Was also told she was anemic & put on iron supplements last month but has not been taking them regularly.   OB History    Gravida Para Term Preterm AB TAB SAB Ectopic Multiple Living   1 1 1  0 0 0 0 0 0 1      Past Medical History  Diagnosis Date  . Fibroid   . Vaginal Pap smear, abnormal     bx was ok    Past Surgical History  Procedure Laterality Date  . No past surgeries      Family History  Problem Relation Age of Onset  . Diabetes Mother   . Diabetes Father   . Hypertension Father   . Heart disease Father     Social History  Substance Use Topics  . Smoking status: Never Smoker   . Smokeless tobacco: Never Used  . Alcohol Use: Yes     Comment: occ    Allergies: No Known Allergies  Prescriptions prior to admission  Medication Sig Dispense Refill Last Dose  . ibuprofen (ADVIL,MOTRIN) 200 MG tablet Take 200 mg by mouth every 6 (six) hours as needed for moderate pain.   06/06/2016 at Unknown time    Review of Systems  Constitutional: Negative.   Gastrointestinal: Positive for abdominal pain. Negative for nausea, vomiting, diarrhea and constipation.  Genitourinary: Negative for dysuria.       + vaginal spotting  Neurological: Negative for headaches.   Physical Exam    Blood pressure 120/77, pulse 93, temperature 98 F (36.7 C), temperature source Oral, resp. rate 18, last menstrual period 05/24/2016.  Physical Exam  Nursing note and vitals reviewed. Constitutional: She is oriented to person, place, and time. She appears well-developed and well-nourished. No distress.  HENT:  Head: Normocephalic and atraumatic.  Eyes: Conjunctivae are normal. Right eye exhibits no discharge. Left eye exhibits no discharge. No scleral icterus.  Neck: Normal range of motion.  Cardiovascular: Normal rate, regular rhythm and normal heart sounds.   No murmur heard. Respiratory: Effort normal and breath sounds normal. No respiratory distress. She has no wheezes.  GI: Soft. Bowel sounds are normal. She exhibits no distension. There is tenderness in the left lower quadrant. There is no rigidity, no rebound, no guarding and no tenderness at McBurney's point.  Neurological: She is alert and oriented to person, place, and time.  Skin: Skin is warm and dry. She is not diaphoretic.  Psychiatric: She has a normal mood and affect. Her behavior is normal. Judgment and thought content normal.    MAU Course  Procedures Results for orders placed or performed during the hospital encounter of 06/07/16 (from the past 24 hour(s))  CBC     Status: Abnormal   Collection Time: 06/07/16  8:41 AM  Result Value Ref Range   WBC 12.6 (H) 4.0 - 10.5 K/uL   RBC 3.26 (L) 3.87 - 5.11 MIL/uL   Hemoglobin 9.8 (L) 12.0 - 15.0 g/dL   HCT 29.4 (L) 36.0 - 46.0 %   MCV 90.2 78.0 - 100.0 fL   MCH 30.1 26.0 - 34.0 pg   MCHC 33.3 30.0 - 36.0 g/dL   RDW 13.2 11.5 - 15.5 %   Platelets 536 (H) 150 - 400 K/uL    MDM UPT negative -- results didn't cross over into EPIC CBC -- discussed importance of iron supplements to treat anemia Toradol 60 mg IM  Assessment and Plan  A: 1. Uterine leiomyoma, unspecified location   2. Left lower quadrant pain   3. Anemia, unspecified anemia type      P: Discharge home Continue iron supplements & increase dietary iron Rx naproxen Msg sent to clinic for appt Records requested from health dept for ultrasound report  Jorje Guild 06/07/2016, 8:27 AM

## 2016-06-07 NOTE — Discharge Instructions (Signed)
Anemia, Nonspecific Anemia is a condition in which the concentration of red blood cells or hemoglobin in the blood is below normal. Hemoglobin is a substance in red blood cells that carries oxygen to the tissues of the body. Anemia results in not enough oxygen reaching these tissues.  CAUSES  Common causes of anemia include:   Excessive bleeding. Bleeding may be internal or external. This includes excessive bleeding from periods (in women) or from the intestine.   Poor nutrition.   Chronic kidney, thyroid, and liver disease.  Bone marrow disorders that decrease red blood cell production.  Cancer and treatments for cancer.  HIV, AIDS, and their treatments.  Spleen problems that increase red blood cell destruction.  Blood disorders.  Excess destruction of red blood cells due to infection, medicines, and autoimmune disorders. SIGNS AND SYMPTOMS   Minor weakness.   Dizziness.   Headache.  Palpitations.   Shortness of breath, especially with exercise.   Paleness.  Cold sensitivity.  Indigestion.  Nausea.  Difficulty sleeping.  Difficulty concentrating. Symptoms may occur suddenly or they may develop slowly.  DIAGNOSIS  Additional blood tests are often needed. These help your health care provider determine the best treatment. Your health care provider will check your stool for blood and look for other causes of blood loss.  TREATMENT  Treatment varies depending on the cause of the anemia. Treatment can include:   Supplements of iron, vitamin 123456, or folic acid.   Hormone medicines.   A blood transfusion. This may be needed if blood loss is severe.   Hospitalization. This may be needed if there is significant continual blood loss.   Dietary changes.  Spleen removal. HOME CARE INSTRUCTIONS Keep all follow-up appointments. It often takes many weeks to correct anemia, and having your health care provider check on your condition and your response to  treatment is very important. SEEK IMMEDIATE MEDICAL CARE IF:   You develop extreme weakness, shortness of breath, or chest pain.   You become dizzy or have trouble concentrating.  You develop heavy vaginal bleeding.   You develop a rash.   You have bloody or black, tarry stools.   You faint.   You vomit up blood.   You vomit repeatedly.   You have abdominal pain.  You have a fever or persistent symptoms for more than 2-3 days.   You have a fever and your symptoms suddenly get worse.   You are dehydrated.  MAKE SURE YOU:  Understand these instructions.  Will watch your condition.  Will get help right away if you are not doing well or get worse.   This information is not intended to replace advice given to you by your health care provider. Make sure you discuss any questions you have with your health care provider.   Document Released: 01/19/2005 Document Revised: 08/14/2013 Document Reviewed: 06/07/2013 Elsevier Interactive Patient Education 2016 Elsevier Inc. Uterine Fibroids Uterine fibroids are tissue masses (tumors) that can develop in the womb (uterus). They are also called leiomyomas. This type of tumor is not cancerous (benign) and does not spread to other parts of the body outside of the pelvic area, which is between the hip bones. Occasionally, fibroids may develop in the fallopian tubes, in the cervix, or on the support structures (ligaments) that surround the uterus. You can have one or many fibroids. Fibroids can vary in size, weight, and where they grow in the uterus. Some can become quite large. Most fibroids do not require medical treatment. CAUSES A  fibroid can develop when a single uterine cell keeps growing (replicating). Most cells in the human body have a control mechanism that keeps them from replicating without control. SIGNS AND SYMPTOMS Symptoms may include:   Heavy bleeding during your period.  Bleeding or spotting between  periods.  Pelvic pain and pressure.  Bladder problems, such as needing to urinate more often (urinary frequency) or urgently.  Inability to reproduce offspring (infertility).  Miscarriages. DIAGNOSIS Uterine fibroids are diagnosed through a physical exam. Your health care provider may feel the lumpy tumors during a pelvic exam. Ultrasonography and an MRI may be done to determine the size, location, and number of fibroids. TREATMENT Treatment may include:  Watchful waiting. This involves getting the fibroid checked by your health care provider to see if it grows or shrinks. Follow your health care provider's recommendations for how often to have this checked.  Hormone medicines. These can be taken by mouth or given through an intrauterine device (IUD).  Surgery.  Removing the fibroids (myomectomy) or the uterus (hysterectomy).  Removing blood supply to the fibroids (uterine artery embolization). If fibroids interfere with your fertility and you want to become pregnant, your health care provider may recommend having the fibroids removed.  HOME CARE INSTRUCTIONS  Keep all follow-up visits as directed by your health care provider. This is important.  Take medicines only as directed by your health care provider.  If you were prescribed a hormone treatment, take the hormone medicines exactly as directed.  Do not take aspirin, because it can cause bleeding.  Ask your health care provider about taking iron pills and increasing the amount of dark green, leafy vegetables in your diet. These actions can help to boost your blood iron levels, which may be affected by heavy menstrual bleeding.  Pay close attention to your period and tell your health care provider about any changes, such as:  Increased blood flow that requires you to use more pads or tampons than usual per month.  A change in the number of days that your period lasts per month.  A change in symptoms that are associated  with your period, such as abdominal cramping or back pain. SEEK MEDICAL CARE IF:  You have pelvic pain, back pain, or abdominal cramps that cannot be controlled with medicines.  You have an increase in bleeding between and during periods.  You soak tampons or pads in a half hour or less.  You feel lightheaded, extra tired, or weak. SEEK IMMEDIATE MEDICAL CARE IF:  You faint.  You have a sudden increase in pelvic pain.   This information is not intended to replace advice given to you by your health care provider. Make sure you discuss any questions you have with your health care provider.   Document Released: 12/09/2000 Document Revised: 01/02/2015 Document Reviewed: 06/10/2014 Elsevier Interactive Patient Education [employer].

## 2016-06-07 NOTE — MAU Note (Signed)
Urine collected and sent to the lab

## 2016-06-07 NOTE — MAU Note (Signed)
Pt presents complaining of abdominal pain from her fibroids. [facility] States her pain is worse now. Did not try pain medicine at home. Denies abnormal discharge. Some spotting.

## 2016-06-10 ENCOUNTER — Telehealth: Payer: Self-pay | Admitting: *Deleted

## 2016-06-10 NOTE — Telephone Encounter (Signed)
Pt left message stating that she had been seen @ [facility] on 6/13 and was supposed to receive a call with a follow up appt but no one has called. I returned pt's call and advised her of appt on 7/7 @ 0800.  Pt stated she will be out of town on that day and would like an appt the following week. I stated that she will be contacted next week with the change in appt. Pt voiced understanding.

## 2016-06-16 ENCOUNTER — Ambulatory Visit (INDEPENDENT_AMBULATORY_CARE_PROVIDER_SITE_OTHER): Payer: Self-pay | Admitting: Obstetrics and Gynecology

## 2016-06-16 ENCOUNTER — Encounter: Payer: Self-pay | Admitting: Obstetrics and Gynecology

## 2016-06-16 ENCOUNTER — Other Ambulatory Visit: Payer: Self-pay | Admitting: Obstetrics and Gynecology

## 2016-06-16 VITALS — BP 115/77 | HR 79 | Ht 62.0 in | Wt 172.5 lb

## 2016-06-16 DIAGNOSIS — D259 Leiomyoma of uterus, unspecified: Secondary | ICD-10-CM

## 2016-06-16 NOTE — Progress Notes (Signed)
30 yo G1P1 presenting today for the evaluation of fibroid uterus. Patient reports being diagnosed with a fibroid uterus several years ago but was without complaints. Over the course of the past few months, she describes worsening pelvic pressure and worsening dysmenorrhea. She reports monthly 5-day cycles but recently it has changed to 7-day cycles with passage of clots. She reports being anemic and taking iron supplements. She denies chest pain, SOB, lightheadedness/dizziness. She is not currently sexually active. She is not using any contraceptions.  Past Medical History  Diagnosis Date  . Fibroid   . Vaginal Pap smear, abnormal     bx was ok   Past Surgical History  Procedure Laterality Date  . No past surgeries     Family History  Problem Relation Age of Onset  . Diabetes Mother   . Diabetes Father   . Hypertension Father   . Heart disease Father    Social History  Substance Use Topics  . Smoking status: Never Smoker   . Smokeless tobacco: Never Used  . Alcohol Use: Yes     Comment: occ   ROS See pertinent in HPI  Blood pressure 115/77, pulse 79, height 5\' 2"  (1.575 m), weight 172 lb 8 oz (78.245 kg), last menstrual period 05/24/2016. GENERAL: Well-developed, well-nourished female in no acute distress.  ABDOMEN: Soft, nontender, nondistended. Palpable fibroid uterus. PELVIC: Normal external female genitalia. Vagina is pink and rugated.  Normal discharge. Normal appearing cervix. Uterus is 16- weeks in size. No adnexal mass or tenderness. EXTREMITIES: No cyanosis, clubbing, or edema, 2+ distal pulses.   Ultrasound 06/06/16  11 cm uterus with 9 cm fibroid. Left ovary with a 3 cm ovarian cyst. Normal right ovary   A/P 30 yo symptomatic fibroid uterus - Patient is uncertain if she desires to have more children - Normal pap smear 08/2015 - Discussed surgical management with myomectomy. Risks, benefits and alternatives were explained including but not limited to risks of  bleeding, infection and damage to adjacent organs. Patient verbalized understanding. All questions were answered. Financial assistance packet provided

## 2016-06-27 ENCOUNTER — Encounter (HOSPITAL_COMMUNITY): Payer: Self-pay | Admitting: *Deleted

## 2016-07-01 ENCOUNTER — Encounter: Payer: Self-pay | Admitting: Obstetrics & Gynecology

## 2016-07-11 ENCOUNTER — Encounter: Payer: Self-pay | Admitting: Obstetrics & Gynecology

## 2016-08-05 ENCOUNTER — Inpatient Hospital Stay (HOSPITAL_COMMUNITY)
Admission: RE | Admit: 2016-08-05 | Discharge: 2016-08-05 | Disposition: A | Discharge status: Home / Self Care | Payer: Self-pay | Source: Ambulatory Visit

## 2016-08-11 NOTE — Patient Instructions (Addendum)
Your procedure is scheduled on: Tuesday, 8/22  Enter through the Main Entrance of [facility] at: [place] up the phone at the desk and dial [phone].  Call this number if you have problems the morning of surgery: [phone].  Remember: Do NOT eat or drink (including water) after midnight Monday, 8/21  Take these medicines the morning of surgery with a SIP OF WATER:  None  Do NOT wear jewelry (body piercing), metal hair clips/bobby pins, make-up, or nail polish.   Do NOT wear lotions, powders, or perfumes.  You may wear deoderant.  Do NOT shave for 48 hours prior to surgery.  Do NOT bring valuables to the hospital.   Leave suitcase in car.  After surgery it may be brought to your room.  For patients admitted to the hospital, checkout time is 11:00 AM the day of discharge. Home with - to be arranged prior to surgery.

## 2016-08-12 ENCOUNTER — Encounter (HOSPITAL_COMMUNITY): Payer: Self-pay

## 2016-08-12 ENCOUNTER — Encounter (HOSPITAL_COMMUNITY)
Admission: RE | Admit: 2016-08-12 | Discharge: 2016-08-12 | Disposition: A | Discharge status: Home / Self Care | Payer: Self-pay | Source: Ambulatory Visit | Attending: Obstetrics and Gynecology | Admitting: Obstetrics and Gynecology

## 2016-08-12 DIAGNOSIS — Z01812 Encounter for preprocedural laboratory examination: Secondary | ICD-10-CM | POA: Insufficient documentation

## 2016-08-12 HISTORY — DX: Anemia, unspecified: D64.9

## 2016-08-12 LAB — CBC
HEMATOCRIT: 32.2 % — AB (ref 36.0–46.0)
HEMOGLOBIN: 10.7 g/dL — AB (ref 12.0–15.0)
MCH: 28.5 pg (ref 26.0–34.0)
MCHC: 33.2 g/dL (ref 30.0–36.0)
MCV: 85.9 fL (ref 78.0–100.0)
Platelets: 483 10*3/uL — ABNORMAL HIGH (ref 150–400)
RBC: 3.75 MIL/uL — AB (ref 3.87–5.11)
RDW: 15.4 % (ref 11.5–15.5)
WBC: 8.2 10*3/uL (ref 4.0–10.5)

## 2016-08-12 LAB — TYPE AND SCREEN
ABO/RH(D): O POS
Antibody Screen: NEGATIVE

## 2016-08-12 LAB — ABO/RH: ABO/RH(D): O POS

## 2016-08-16 ENCOUNTER — Encounter (HOSPITAL_COMMUNITY): Payer: Self-pay | Admitting: Registered Nurse

## 2016-08-16 ENCOUNTER — Ambulatory Visit (HOSPITAL_COMMUNITY): Payer: Medicaid Other | Admitting: Anesthesiology

## 2016-08-16 ENCOUNTER — Inpatient Hospital Stay (HOSPITAL_COMMUNITY)
Admission: RE | Admit: 2016-08-16 | Discharge: 2016-08-18 | DRG: 743 | Disposition: A | Discharge status: Home / Self Care | Payer: Medicaid Other | Source: Ambulatory Visit | Attending: Obstetrics and Gynecology | Admitting: Obstetrics and Gynecology

## 2016-08-16 ENCOUNTER — Encounter (HOSPITAL_COMMUNITY)
Admission: RE | Disposition: A | Discharge status: Home / Self Care | Payer: Self-pay | Source: Ambulatory Visit | Attending: Obstetrics and Gynecology

## 2016-08-16 DIAGNOSIS — N83202 Unspecified ovarian cyst, left side: Secondary | ICD-10-CM | POA: Diagnosis present

## 2016-08-16 DIAGNOSIS — Z8249 Family history of ischemic heart disease and other diseases of the circulatory system: Secondary | ICD-10-CM

## 2016-08-16 DIAGNOSIS — D259 Leiomyoma of uterus, unspecified: Secondary | ICD-10-CM | POA: Diagnosis present

## 2016-08-16 DIAGNOSIS — Z9889 Other specified postprocedural states: Secondary | ICD-10-CM

## 2016-08-16 DIAGNOSIS — D649 Anemia, unspecified: Secondary | ICD-10-CM | POA: Diagnosis present

## 2016-08-16 DIAGNOSIS — N946 Dysmenorrhea, unspecified: Secondary | ICD-10-CM | POA: Diagnosis present

## 2016-08-16 DIAGNOSIS — Z833 Family history of diabetes mellitus: Secondary | ICD-10-CM

## 2016-08-16 HISTORY — PX: MYOMECTOMY: SHX85

## 2016-08-16 LAB — PREGNANCY, URINE: Preg Test, Ur: NEGATIVE

## 2016-08-16 SURGERY — MYOMECTOMY, ABDOMINAL APPROACH
Anesthesia: General | Site: Abdomen

## 2016-08-16 MED ORDER — FENTANYL CITRATE (PF) 100 MCG/2ML IJ SOLN
INTRAMUSCULAR | Status: DC | PRN
  Administered 2016-08-16: 100 ug via INTRAVENOUS
  Administered 2016-08-16: 150 ug via INTRAVENOUS
  Administered 2016-08-16 (×2): 50 ug via INTRAVENOUS

## 2016-08-16 MED ORDER — FENTANYL CITRATE (PF) 100 MCG/2ML IJ SOLN
INTRAMUSCULAR | Status: AC
  Filled 2016-08-16: qty 2

## 2016-08-16 MED ORDER — DEXAMETHASONE SODIUM PHOSPHATE 10 MG/ML IJ SOLN
INTRAMUSCULAR | Status: AC
Start: 2016-08-16 — End: 2016-08-16
  Filled 2016-08-16: qty 1

## 2016-08-16 MED ORDER — ONDANSETRON HCL 4 MG/2ML IJ SOLN
4.0000 mg | Freq: Four times a day (QID) | INTRAMUSCULAR | Status: DC | PRN
  Administered 2016-08-18: 4 mg via INTRAVENOUS
  Filled 2016-08-16: qty 2

## 2016-08-16 MED ORDER — SODIUM CHLORIDE 0.9 % IJ SOLN
INTRAMUSCULAR | Status: AC
Start: 2016-08-16 — End: 2016-08-16
  Filled 2016-08-16: qty 100

## 2016-08-16 MED ORDER — HYDROMORPHONE HCL 1 MG/ML IJ SOLN
INTRAMUSCULAR | Status: DC | PRN
  Administered 2016-08-16: 0.5 mg via INTRAVENOUS
  Administered 2016-08-16: .5 mg via INTRAVENOUS

## 2016-08-16 MED ORDER — DEXAMETHASONE SODIUM PHOSPHATE 10 MG/ML IJ SOLN
INTRAMUSCULAR | Status: DC | PRN
  Administered 2016-08-16: 10 mg via INTRAVENOUS

## 2016-08-16 MED ORDER — MEPERIDINE HCL 25 MG/ML IJ SOLN: 6.2500 mg | INTRAMUSCULAR | Status: DC | PRN

## 2016-08-16 MED ORDER — HYDROMORPHONE HCL 1 MG/ML IJ SOLN
INTRAMUSCULAR | Status: AC
  Filled 2016-08-16: qty 1

## 2016-08-16 MED ORDER — ONDANSETRON HCL 4 MG/2ML IJ SOLN
INTRAMUSCULAR | Status: AC
  Filled 2016-08-16: qty 2

## 2016-08-16 MED ORDER — ROCURONIUM BROMIDE 100 MG/10ML IV SOLN
INTRAVENOUS | Status: AC
Start: 2016-08-16 — End: 2016-08-16
  Filled 2016-08-16: qty 1

## 2016-08-16 MED ORDER — PROMETHAZINE HCL 25 MG/ML IJ SOLN
INTRAMUSCULAR | Status: AC
  Filled 2016-08-16: qty 1

## 2016-08-16 MED ORDER — DIPHENHYDRAMINE HCL 12.5 MG/5ML PO ELIX
12.5000 mg | ORAL_SOLUTION | Freq: Four times a day (QID) | ORAL | Status: DC | PRN
  Administered 2016-08-17 (×2): 12.5 mg via ORAL
  Filled 2016-08-16 (×2): qty 5

## 2016-08-16 MED ORDER — DOCUSATE SODIUM 100 MG PO CAPS
100.0000 mg | ORAL_CAPSULE | Freq: Two times a day (BID) | ORAL | Status: DC
  Administered 2016-08-16 – 2016-08-17 (×3): 100 mg via ORAL
  Filled 2016-08-16 (×4): qty 1

## 2016-08-16 MED ORDER — PROPOFOL 10 MG/ML IV BOLUS
INTRAVENOUS | Status: DC | PRN
  Administered 2016-08-16: 150 mg via INTRAVENOUS

## 2016-08-16 MED ORDER — LACTATED RINGERS IV SOLN
INTRAVENOUS | Status: DC
  Administered 2016-08-17: 04:00:00 via INTRAVENOUS

## 2016-08-16 MED ORDER — MIDAZOLAM HCL 2 MG/2ML IJ SOLN
INTRAMUSCULAR | Status: AC
  Filled 2016-08-16: qty 2

## 2016-08-16 MED ORDER — BUPIVACAINE HCL (PF) 0.5 % IJ SOLN
INTRAMUSCULAR | Status: DC | PRN
  Administered 2016-08-16: 20 mL

## 2016-08-16 MED ORDER — PROPOFOL 10 MG/ML IV BOLUS
INTRAVENOUS | Status: AC
  Filled 2016-08-16: qty 20

## 2016-08-16 MED ORDER — HYDROMORPHONE 1 MG/ML IV SOLN
INTRAVENOUS | Status: DC
  Administered 2016-08-16: 1.8 mg via INTRAVENOUS
  Administered 2016-08-16: 13:00:00 via INTRAVENOUS
  Administered 2016-08-17: 1.2 mg via INTRAVENOUS
  Administered 2016-08-17: 0.9 mL via INTRAVENOUS
  Administered 2016-08-17: 0.6 mg via INTRAVENOUS
  Filled 2016-08-16: qty 25

## 2016-08-16 MED ORDER — ONDANSETRON HCL 4 MG/2ML IJ SOLN: 4.0000 mg | Freq: Once | INTRAMUSCULAR | Status: DC | PRN

## 2016-08-16 MED ORDER — HYDROMORPHONE HCL 1 MG/ML IJ SOLN
0.2500 mg | INTRAMUSCULAR | Status: DC | PRN
  Administered 2016-08-16 (×2): 0.5 mg via INTRAVENOUS

## 2016-08-16 MED ORDER — ROCURONIUM BROMIDE 100 MG/10ML IV SOLN
INTRAVENOUS | Status: DC | PRN
  Administered 2016-08-16: 50 mg via INTRAVENOUS

## 2016-08-16 MED ORDER — SODIUM CHLORIDE 0.9% FLUSH: 9.0000 mL | INTRAVENOUS | Status: DC | PRN

## 2016-08-16 MED ORDER — LACTATED RINGERS IV SOLN
INTRAVENOUS | Status: DC
  Administered 2016-08-16: 1000 mL via INTRAVENOUS
  Administered 2016-08-16 (×2): via INTRAVENOUS

## 2016-08-16 MED ORDER — VASOPRESSIN 20 UNIT/ML IV SOLN
INTRAVENOUS | Status: AC
  Filled 2016-08-16: qty 1

## 2016-08-16 MED ORDER — SUGAMMADEX SODIUM 200 MG/2ML IV SOLN
INTRAVENOUS | Status: AC
  Filled 2016-08-16: qty 2

## 2016-08-16 MED ORDER — ONDANSETRON HCL 4 MG/2ML IJ SOLN
INTRAMUSCULAR | Status: DC | PRN
  Administered 2016-08-16: 4 mg via INTRAVENOUS

## 2016-08-16 MED ORDER — BUPIVACAINE HCL (PF) 0.5 % IJ SOLN
INTRAMUSCULAR | Status: AC
  Filled 2016-08-16: qty 30

## 2016-08-16 MED ORDER — SCOPOLAMINE 1 MG/3DAYS TD PT72
1.0000 | MEDICATED_PATCH | Freq: Once | TRANSDERMAL | Status: DC
  Administered 2016-08-16: 1.5 mg via TRANSDERMAL

## 2016-08-16 MED ORDER — PROMETHAZINE HCL 25 MG/ML IJ SOLN
6.2500 mg | Freq: Once | INTRAMUSCULAR | Status: AC
  Administered 2016-08-16: 6.25 mg via INTRAVENOUS

## 2016-08-16 MED ORDER — VASOPRESSIN 20 UNIT/ML IV SOLN
INTRAVENOUS | Status: DC | PRN
  Administered 2016-08-16: 12 mL via INTRAMUSCULAR

## 2016-08-16 MED ORDER — DIPHENHYDRAMINE HCL 50 MG/ML IJ SOLN: 12.5000 mg | Freq: Four times a day (QID) | INTRAMUSCULAR | Status: DC | PRN

## 2016-08-16 MED ORDER — NALOXONE HCL 0.4 MG/ML IJ SOLN: 0.4000 mg | INTRAMUSCULAR | Status: DC | PRN

## 2016-08-16 MED ORDER — SCOPOLAMINE 1 MG/3DAYS TD PT72
MEDICATED_PATCH | TRANSDERMAL | Status: AC
  Administered 2016-08-16: 1.5 mg via TRANSDERMAL
  Filled 2016-08-16: qty 1

## 2016-08-16 MED ORDER — LIDOCAINE 2% (20 MG/ML) 5 ML SYRINGE
INTRAMUSCULAR | Status: DC | PRN
  Administered 2016-08-16: 100 mg via INTRAVENOUS

## 2016-08-16 MED ORDER — LIDOCAINE HCL (CARDIAC) 20 MG/ML IV SOLN
INTRAVENOUS | Status: AC
  Filled 2016-08-16: qty 5

## 2016-08-16 MED ORDER — FENTANYL CITRATE (PF) 250 MCG/5ML IJ SOLN
INTRAMUSCULAR | Status: AC
  Filled 2016-08-16: qty 5

## 2016-08-16 MED ORDER — MIDAZOLAM HCL 5 MG/5ML IJ SOLN
INTRAMUSCULAR | Status: DC | PRN
  Administered 2016-08-16: 2 mg via INTRAVENOUS

## 2016-08-16 MED ORDER — SUGAMMADEX SODIUM 200 MG/2ML IV SOLN
INTRAVENOUS | Status: DC | PRN
  Administered 2016-08-16: 200 mg via INTRAVENOUS

## 2016-08-16 MED ORDER — CEFAZOLIN SODIUM-DEXTROSE 2-4 GM/100ML-% IV SOLN
2.0000 g | INTRAVENOUS | Status: AC
  Administered 2016-08-16: 2 g via INTRAVENOUS

## 2016-08-16 SURGICAL SUPPLY — 36 items
BARRIER ADHS 3X4 INTERCEED (GAUZE/BANDAGES/DRESSINGS) ×3 IMPLANT
CANISTER SUCT 3000ML (MISCELLANEOUS) ×3 IMPLANT
CONT PATH 16OZ SNAP LID 3702 (MISCELLANEOUS) ×3 IMPLANT
DECANTER SPIKE VIAL GLASS SM (MISCELLANEOUS) IMPLANT
DRSG OPSITE POSTOP 4X10 (GAUZE/BANDAGES/DRESSINGS) ×3 IMPLANT
DURAPREP 26ML APPLICATOR (WOUND CARE) ×3 IMPLANT
GAUZE SPONGE 4X4 16PLY XRAY LF (GAUZE/BANDAGES/DRESSINGS) IMPLANT
GLOVE BIOGEL PI IND STRL 6.5 (GLOVE) ×1 IMPLANT
GLOVE BIOGEL PI IND STRL 7.0 (GLOVE) ×1 IMPLANT
GLOVE BIOGEL PI INDICATOR 6.5 (GLOVE) ×2
GLOVE BIOGEL PI INDICATOR 7.0 (GLOVE) ×2
GLOVE SURG SS PI 6.0 STRL IVOR (GLOVE) ×3 IMPLANT
GOWN STRL REUS W/TWL LRG LVL3 (GOWN DISPOSABLE) ×9 IMPLANT
NEEDLE HYPO 22GX1.5 SAFETY (NEEDLE) IMPLANT
NS IRRIG 1000ML POUR BTL (IV SOLUTION) ×3 IMPLANT
PACK ABDOMINAL GYN (CUSTOM PROCEDURE TRAY) ×3 IMPLANT
PAD OB MATERNITY 4.3X12.25 (PERSONAL CARE ITEMS) ×3 IMPLANT
PENCIL SMOKE EVAC W/HOLSTER (ELECTROSURGICAL) ×3 IMPLANT
PROTECTOR NERVE ULNAR (MISCELLANEOUS) ×6 IMPLANT
SPONGE LAP 18X18 X RAY DECT (DISPOSABLE) IMPLANT
STAPLER VISISTAT 35W (STAPLE) ×3 IMPLANT
SUT PROLENE 2 0 CT 30 (SUTURE) ×3 IMPLANT
SUT VIC AB 0 CT1 18XCR BRD8 (SUTURE) ×2 IMPLANT
SUT VIC AB 0 CT1 27 (SUTURE)
SUT VIC AB 0 CT1 27XBRD ANBCTR (SUTURE) IMPLANT
SUT VIC AB 0 CT1 36 (SUTURE) ×12 IMPLANT
SUT VIC AB 0 CT1 8-18 (SUTURE) ×4
SUT VIC AB 2-0 CT1 27 (SUTURE)
SUT VIC AB 2-0 CT1 TAPERPNT 27 (SUTURE) IMPLANT
SUT VIC AB 4-0 KS 27 (SUTURE) ×3 IMPLANT
SUT VIC AB 4-0 SH 27 (SUTURE)
SUT VIC AB 4-0 SH 27XANBCTRL (SUTURE) IMPLANT
SYR CONTROL 10ML LL (SYRINGE) IMPLANT
TOWEL OR 17X24 6PK STRL BLUE (TOWEL DISPOSABLE) ×6 IMPLANT
TRAY FOLEY CATH SILVER 14FR (SET/KITS/TRAYS/PACK) ×3 IMPLANT
WATER STERILE IRR 1000ML POUR (IV SOLUTION) ×3 IMPLANT

## 2016-08-16 NOTE — Transfer of Care (Signed)
Immediate Anesthesia Transfer of Care Note  Patient: Kristi Kirby  Procedure(s) Performed: Procedure(s): MYOMECTOMY (N/A)  Patient Location: PACU  Anesthesia Type:General  Level of Consciousness:  sedated, patient cooperative and responds to stimulation  Airway & Oxygen Therapy:Patient Spontanous Breathing and Patient connected to face mask oxgen  Post-op Assessment:  Report given to PACU RN and Post -op Vital signs reviewed and stable  Post vital signs:  Reviewed and stable  Last Vitals:  Vitals:   08/16/16 0827  BP: 136/90  Pulse: 78  Resp: 18  Temp: 0000000 C    Complications: No apparent anesthesia complications

## 2016-08-16 NOTE — Anesthesia Procedure Notes (Signed)
Procedure Name: Intubation Date/Time: 08/16/2016 8:53 AM Performed by: Talbot Grumbling Pre-anesthesia Checklist: Patient identified, Emergency Drugs available, Suction available and Patient being monitored Patient Re-evaluated:Patient Re-evaluated prior to inductionOxygen Delivery Method: Circle system utilized Preoxygenation: Pre-oxygenation with 100% oxygen Intubation Type: IV induction Ventilation: Mask ventilation without difficulty Laryngoscope Size: Miller and 2 Grade View: Grade I Tube type: Oral Tube size: 7.0 mm Number of attempts: 1 Airway Equipment and Method: Stylet Placement Confirmation: ETT inserted through vocal cords under direct vision,  positive ETCO2 and breath sounds checked- equal and bilateral Secured at: 21 cm Tube secured with: Tape Dental Injury: Teeth and Oropharynx as per pre-operative assessment

## 2016-08-16 NOTE — Anesthesia Preprocedure Evaluation (Addendum)
Anesthesia Evaluation  Patient identified by MRN, date of birth, ID band Patient awake    Reviewed: Allergy & Precautions, NPO status , Patient's Chart, lab work & pertinent test results  Airway Mallampati: I  TM Distance: >3 FB Neck ROM: Full    Dental   Pulmonary    Pulmonary exam normal        Cardiovascular Normal cardiovascular exam     Neuro/Psych    GI/Hepatic   Endo/Other    Renal/GU      Musculoskeletal   Abdominal   Peds  Hematology   Anesthesia Other Findings   Reproductive/Obstetrics                             Anesthesia Physical Anesthesia Plan  ASA: II  Anesthesia Plan: General   Post-op Pain Management:    Induction: Intravenous  Airway Management Planned: Oral ETT  Additional Equipment:   Intra-op Plan:   Post-operative Plan: Extubation in OR  Informed Consent: I have reviewed the patients History and Physical, chart, labs and discussed the procedure including the risks, benefits and alternatives for the proposed anesthesia with the patient or authorized representative who has indicated his/her understanding and acceptance.     Plan Discussed with: CRNA and Surgeon  Anesthesia Plan Comments:         Anesthesia Quick Evaluation  

## 2016-08-16 NOTE — Anesthesia Postprocedure Evaluation (Signed)
Anesthesia Post Note  Patient: PHOUA BRELSFORD  Procedure(s) Performed: Procedure(s) (LRB): MYOMECTOMY (N/A)  Patient location during evaluation: PACU Anesthesia Type: General Level of consciousness: awake and alert Pain management: pain level controlled Vital Signs Assessment: post-procedure vital signs reviewed and stable Respiratory status: spontaneous breathing, nonlabored ventilation, respiratory function stable and patient connected to nasal cannula oxygen Cardiovascular status: blood pressure returned to baseline and stable Postop Assessment: no signs of nausea or vomiting Anesthetic complications: no     Last Vitals:  Vitals:   08/16/16 0827 08/16/16 0956  BP: 136/90   Pulse: 78   Resp: 18 16  Temp: 37.2 C 36.9 C    Last Pain:  Vitals:   08/16/16 0956  TempSrc:   PainSc: 7    Pain Goal: Patients Stated Pain Goal: 2 (08/16/16 0827)               Lillia Abed DAVID

## 2016-08-16 NOTE — H&P (Signed)
Kristi Kirby is an 30 y.o. female G1P1 here for scheduled surgical intervention for fibroid uterus. Patient reports heavier menstrual cycle with passage of clots resulting in anemia. She never required a blood transfusion. She also reports some dysmenorrhea and Kristi Kirby pelvic pressure. She desires to preserve her fertility at this time  Pertinent Gynecological History: Menses: regular every month without intermenstrual spotting Bleeding: dysfunctional uterine bleeding Contraception: abstinence DES exposure: denies Blood transfusions: none Last pap: normal Date: 08/2015 OB History: G1, P1   Menstrual History: Patient's last menstrual period was 07/24/2016 (exact date).    Past Medical History:  Diagnosis Date  . Anemia   . Fibroid   . SVD (spontaneous vaginal delivery)    x 1  . Vaginal Pap smear, abnormal    bx was ok    Past Surgical History:  Procedure Laterality Date  . NO PAST SURGERIES      Family History  Problem Relation Age of Onset  . Diabetes Mother   . Diabetes Father   . Hypertension Father   . Heart disease Father     Social History:  reports that she has never smoked. She has never used smokeless tobacco. She reports that she drinks alcohol. She reports that she does not use drugs.  Allergies: No Known Allergies  Prescriptions Prior to Admission  Medication Sig Dispense Refill Last Dose  . ferrous sulfate 325 (65 FE) MG tablet Take 325 mg by mouth 2 (two) times daily with a meal.   Past Week at Unknown time  . naproxen (NAPROSYN) 500 MG tablet Take 1 tablet (500 mg total) by mouth 2 (two) times daily. (Patient taking differently: Take 500 mg by mouth 2 (two) times daily as needed for mild pain. ) 30 tablet 0 Past Week at Unknown time    ROS See pertinent in HPI  Blood pressure 136/90, pulse 78, temperature 98.9 F (37.2 C), temperature source Oral, resp. rate 18, last menstrual period 07/24/2016, SpO2 100 %. Physical Exam GENERAL: Well-developed,  well-nourished female in no acute distress.  HEENT: Normocephalic, atraumatic. Sclerae anicteric.  NECK: Supple. Normal thyroid.  LUNGS: Clear to auscultation bilaterally.  HEART: Regular rate and rhythm. BREASTS: Symmetric in size. No palpable masses or lymphadenopathy, skin changes, or nipple drainage. ABDOMEN: Soft, nontender, nondistended. No organomegaly. PELVIC: Deferred to OR EXTREMITIES: No cyanosis, clubbing, or edema, 2+ distal pulses.  Results for orders placed or performed during the hospital encounter of 08/16/16 (from the past 24 hour(s))  Pregnancy, urine     Status: None   Collection Time: 08/16/16  7:57 AM  Result Value Ref Range   Preg Test, Ur NEGATIVE NEGATIVE    No results found.   Ultrasound 06/06/16  11 cm uterus with 9 cm fibroid. Left ovary with a 3 cm ovarian cyst. Normal right ovary  Assessment/Plan: 30 yo with symptomatic fibroid uterus here for myomectomy - Risks, benefits and alternatives were explained including but not limited to risks of bleeding, infection and damage to adjacent organs. Patient verbalized understanding and all questions were answered  Kristi Kirby 08/16/2016, 8:42 AM

## 2016-08-16 NOTE — Op Note (Signed)
Kristi Kirby PROCEDURE DATE: 08/16/2016  PREOPERATIVE DIAGNOSIS: Symptomatic uterine leiomyomata. POSTOPERATIVE DIAGNOSIS: The same PROCEDURE: Abdominal Myomectomy SURGEON:  Dr. Mora Bellman ASSISTANT: Dr. Clovia Cuff  INDICATIONS: 30 y.o. G1P1001 here for abdominal myomectomy for symptomatic uterine leiomyomata.  Risks of surgery were discussed with the patient including but not limited to: bleeding which may require transfusion or reoperation; infection which may require antibiotics; injury to bowel, bladder, ureters or other surrounding organs; need for additional procedures; thromboembolic phenomenon, incisional problems and other postoperative/anesthesia complications. Written informed consent was obtained.    FINDINGS:  Uterine leiomyomata. 6 in number, ranging in size  1- 9 cm, with total weight of 352.5 gm  ANESTHESIA:    General INTRAVENOUS FLUIDS:1700  ml ESTIMATED BLOOD LOSS:300 ml URINE OUTPUT: 100 ml SPECIMENS: Leiomyomata sent to pathology COMPLICATIONS: None immediate  PROCEDURE IN DETAIL:  The patient received IV preoperative antibiotics approximately 30 minutes prior to procedure.  She was then taken to the operating room and general anesthesia was administered without difficulty.  She was placed in dorsal supine position and prepped and draped in a sterile manner.  A Foley catheter was inserted into the bladder and attached to Kristi Kirby drainage.  After an adequate timeout was performed, attention was then turned to the abdomen where a Pfannenstiel incision was made with a scalpel.  This incision was carried down to the fascia using electrocautery.  The fascia was incised in the midline and this incision was extended bilaterally using the Mayo scissors.  Kocher's were applied to the superior aspect of the fascial incision, and the rectus muscles were dissected off bluntly and sharply using the Mayo scissors.  A similar process was carried out at the inferior aspect of the  incision.  The rectus muscles were separated in the midline bluntly and the peritoneum was identified, picked up and incised with the scalpel.  This incision was extended superiorly and inferiorly using the scalpel with good visualization of the bladder and bowel.  Attention was then turned to the uterus where multiple uterine leiomyomata were noted.  The uterus was then delivered up through the incision.  Attention was then turned to the uterine surface where the largest leiomyoma was identified. Vasopressin solution was injected over the surface of the leiomyoma to aid with hemostasis.   A vertical incision was made over the uterine leiomyoma into the leiomyoma, and the capsule was recognized.  Using blunt methods, the leiomyoma was freed from the surrounding myometrial tissue and removed intact.  Other leiomyomata were removed in similar fashion. A 2 cm myoma in very close proximity to the right fallopian tube was left in place as to avoid damaging the tube.  After removal of all the leiomyomata which were both on the anterior and posterior aspects of the uterus, the incisions were closed in layers, initiated by using 3-0 Vicryl in figure-of-eight stitches to close the breach into the endometrial cavity.  The deeper layers were also reapproximated using 0 Vicryl running stitches, and the serosa was reapproximated using 0 Vicryl imbricating stitch.  Overall good hemostasis was noted.   The uterus was returned to the abdomen and an Interceed sheet was placed over the uterus as an adhesive barrier. The abdominal retractor was then removed. The fascia was reapproximated with 0 Vicryl running stitch and the subcutaneous layer was reapproximated with 3-0 plain gut interrupted sutures.  The skin was closed with 3-0 Vicryl subcuticular stitch.  The patient tolerated the procedure well. There were no complications during this case.  Sponge, lap, needle and instrument counts were correct x2.  The patient was taken to the  recovery room extubated and in stable condition.

## 2016-08-17 LAB — CBC
HCT: 27.4 % — ABNORMAL LOW (ref 36.0–46.0)
Hemoglobin: 8.8 g/dL — ABNORMAL LOW (ref 12.0–15.0)
MCH: 28.3 pg (ref 26.0–34.0)
MCHC: 32.1 g/dL (ref 30.0–36.0)
MCV: 88.1 fL (ref 78.0–100.0)
PLATELETS: 400 10*3/uL (ref 150–400)
RBC: 3.11 MIL/uL — AB (ref 3.87–5.11)
RDW: 15.6 % — ABNORMAL HIGH (ref 11.5–15.5)
WBC: 11 10*3/uL — ABNORMAL HIGH (ref 4.0–10.5)

## 2016-08-17 MED ORDER — DOCUSATE SODIUM 100 MG PO CAPS
100.0000 mg | ORAL_CAPSULE | Freq: Two times a day (BID) | ORAL | Status: DC
  Filled 2016-08-17: qty 1

## 2016-08-17 MED ORDER — FERROUS SULFATE 325 (65 FE) MG PO TABS
325.0000 mg | ORAL_TABLET | Freq: Three times a day (TID) | ORAL | Status: DC
  Administered 2016-08-17 (×2): 325 mg via ORAL
  Filled 2016-08-17 (×3): qty 1

## 2016-08-17 MED ORDER — IBUPROFEN 600 MG PO TABS
600.0000 mg | ORAL_TABLET | Freq: Four times a day (QID) | ORAL | Status: DC | PRN
  Administered 2016-08-17 – 2016-08-18 (×3): 600 mg via ORAL
  Filled 2016-08-17 (×3): qty 1

## 2016-08-17 MED ORDER — OXYCODONE-ACETAMINOPHEN 5-325 MG PO TABS
1.0000 | ORAL_TABLET | Freq: Four times a day (QID) | ORAL | Status: DC | PRN
  Administered 2016-08-17: 2 via ORAL
  Administered 2016-08-17: 1 via ORAL
  Administered 2016-08-17: 2 via ORAL
  Administered 2016-08-18: 1 via ORAL
  Filled 2016-08-17 (×2): qty 2
  Filled 2016-08-17 (×2): qty 1

## 2016-08-17 NOTE — Progress Notes (Signed)
Pt called out. New gown and items for showering provided.

## 2016-08-17 NOTE — Anesthesia Postprocedure Evaluation (Signed)
Anesthesia Post Note  Patient: Kristi Kirby  Procedure(s) Performed: Procedure(s) (LRB): MYOMECTOMY (N/A)  Patient location during evaluation: [facility] Anesthesia Type: General Level of consciousness: awake and alert Pain management: satisfactory to patient Vital Signs Assessment: post-procedure vital signs reviewed and stable Respiratory status: spontaneous breathing and respiratory function stable Cardiovascular status: stable Postop Assessment: adequate PO intake Anesthetic complications: no     Last Vitals:  Vitals:   08/17/16 0624 08/17/16 0746  BP:    Pulse:    Resp: 16 14  Temp:      Last Pain:  Vitals:   08/17/16 0922  TempSrc:   PainSc: 2    Pain Goal: Patients Stated Pain Goal: 2 (08/17/16 [id number])               Katherina Mires

## 2016-08-17 NOTE — Progress Notes (Signed)
1 Day Post-Op Procedure(s) (LRB): MYOMECTOMY (N/A)  Subjective: Patient reports feeling well this morning. She denies any chest pain, SOB, lightheadedness/dizziness, nausea or emesis. She tolerated a yogurt and broccoli soup yesterday evening. She ambulated.   Objective: I have reviewed patient's vital signs, intake and output and labs.  General: alert, cooperative and no distress Resp: clear to auscultation bilaterally Cardio: regular rate and rhythm GI: soft, appropriately tender, non distended, + BS. Honey comb dressing clean and intact Extremities: extremities normal, atraumatic, no cyanosis or edema  Assessment: s/p Procedure(s): MYOMECTOMY (N/A): stable and progressing well  Plan: Advance diet Encourage ambulation Advance to PO medication Discontinue IV fluids  Discontinue foley catheter Discharge planning for tomorrow  LOS: 1 day    Kristi Kirby 08/17/2016, 8:00 AM

## 2016-08-17 NOTE — Addendum Note (Signed)
Addendum  created 08/17/16 1008 by Flossie Dibble, CRNA   Sign clinical note

## 2016-08-18 MED ORDER — DOCUSATE SODIUM 100 MG PO CAPS: 100.0000 mg | ORAL_CAPSULE | Freq: Two times a day (BID) | ORAL | 1 refills | Status: DC

## 2016-08-18 MED ORDER — IBUPROFEN 600 MG PO TABS: 600.0000 mg | ORAL_TABLET | Freq: Four times a day (QID) | ORAL | 3 refills | Status: DC | PRN

## 2016-08-18 MED ORDER — OXYCODONE-ACETAMINOPHEN 5-325 MG PO TABS: 1.0000 | ORAL_TABLET | Freq: Four times a day (QID) | ORAL | 0 refills | Status: DC | PRN

## 2016-08-18 NOTE — Progress Notes (Signed)
Pt. Is discharged in the care of friend. Downstairs per ambulatory, with N.T escort. Denies any pain or discomfort. No equipment needed for home use. Understands all instructions well.

## 2016-08-18 NOTE — Discharge Instructions (Signed)
Myomectomy, Care After Refer to this sheet in the next few weeks. These instructions provide you with information on caring for yourself after your procedure. Your health care provider may also give you more specific instructions. Your treatment has been planned according to current medical practices, but problems sometimes occur. Call your health care provider if you have any problems or questions after your procedure. WHAT TO EXPECT AFTER THE PROCEDURE After your procedure, it is typical to have the following:  Pain in your abdomen, especially at any incision sites. You will be given pain medicine to control the pain.  Tiredness. This is a normal part of the recovery process. Your energy level will return to normal over the next several weeks.  Constipation.  Vaginal bleeding. This is normal and should stop after 1-2 weeks. HOME CARE INSTRUCTIONS   Only take over-the-counter or prescription medicines as directed by your health care provider. Avoid aspirin because it can cause bleeding.  Do not douche, use tampons, or have sexual intercourse until given permission by your health care provider.  Remove or change any bandages (dressings) as directed by your health care provider.  Take showers instead of baths as directed by your health care provider.  You will probably be able to go back to your normal routine after a few days. Do not do anything that requires extra effort until your health care provider says it is okay. Do not lift anything heavier than 15 pounds (6.8 kg) until your health care provider approves.  Walk daily but take frequent rest breaks if you tire easily.  Continue to practice deep breathing and coughing. If it hurts to cough, try holding a pillow against your belly as you cough.  If you become constipated, you may:  Use a mild laxative if your health care provider approves.  Add more fruit and bran to your diet.  Drink enough fluids to keep your urine clear or  pale yellow.  Take your temperature twice a day and write it down.  Do not drink alcohol.  Do not drive until your health care provider approves.  Have someone help you at home for 1 week or until you can do your own household activities.  Follow up with your health care provider as directed. SEEK MEDICAL CARE IF:  You have a fever.  You have increasing abdominal pain that is not relieved with medicine.  You have nausea, vomiting, or diarrhea.  You have pain when you urinate, or you have blood in your urine.  You have a rash on your body.  You have pain or redness where your IV access tube was inserted.  You have redness, swelling, or any kind of drainage from an incision. SEEK IMMEDIATE MEDICAL CARE IF:   You have weakness or lightheadedness.  You have pain, swelling, or redness in your legs.  You have chest pain.  You faint.  You have shortness of breath.  You have heavy vaginal bleeding.  Your incision is opening up.   This information is not intended to replace advice given to you by your health care provider. Make sure you discuss any questions you have with your health care provider.   Document Released: 05/04/2011 Document Revised: 01/02/2015 Document Reviewed: 07/24/2013 Elsevier Interactive Patient Education 2016 Elsevier Inc[employer] Myomectomy Myomectomy is surgery to remove a noncancerous tumor (myoma) from the uterus. Myomas are tumors made up of fibrous tissue. They are often called fibroid tumors. Fibroid tumors can range from the size of a pea to the  size of a grapefruit. In a myomectomy, the fibroid tumor is removed without removing the uterus. Because these tumors are rarely cancerous, this surgery is usually done only if the tumor is growing or causing symptoms such as pain, pressure, bleeding, or pain with intercourse. LET [facility] CARE PROVIDER KNOW ABOUT:  Any allergies you have.  All medicines you are taking, including vitamins, herbs, eye drops,  creams, and over-the-counter medicines.  Previous problems you or members of your family have had with the use of anesthetics.  Any blood disorders you have.  Previous surgeries you have had.  Medical conditions you have. RISKS AND COMPLICATIONS  Generally, this is a safe procedure. However, as with any procedure, complications can occur. Possible complications include:  Excessive bleeding.  Infection.  Injury to nearby organs.  Blood clots in the legs, chest, or brain.  Scar tissue on other organs and in the pelvis. This may require another surgery to remove the scar tissue. BEFORE THE PROCEDURE  Ask your health care provider about changing or stopping your regular medicines. Avoid taking aspirin or blood thinners as directed by your health care provider.  Do not  eat or drink anything after midnight on the night before surgery.  If you smoke, do not  smoke for 2 weeks before the surgery.  Do not  drink alcohol the day before the surgery.  Arrange for someone to drive you home after the procedure or after your hospital stay. Also arrange for someone to help you with activities during your recovery. PROCEDURE You will be given medicine to make you sleep through the procedure (general anesthetic). Any of the following methods may be used to perform a myomectomy:  Small monitors will be put on your body. They are used to check your heart, blood pressure, and oxygen level.  An IV access tube will be put into one of your veins. Medicine will be able to flow directly into your body through this IV tube.  You might be given a medicine to help you relax (sedative).  You will be given a medicine to make you sleep (general anesthetic). A breathing tube will be placed into your lungs during the procedure.  A thin, flexible tube (catheter) will be inserted into your bladder to collect urine.  Any of the following methods may be used to perform a myomectomy:  Hysteroscopic  myomectomy--This method may be used when the fibroid tumor is inside the cavity of the uterus. A long, thin tube that is like a telescope (hysteroscope) is inserted inside the uterus. A saline solution is put into your uterus. This expands the uterus and allows the surgeon to see the fibroids. Tools are passed through the hysteroscope to remove the fibroid tumor in pieces.  Laparoscopic myomectomy--A few small cuts (incisions) are made in the lower abdomen. A thin, lighted tube with a tiny camera on the end (laparoscope) is inserted through one of the incisions. This gives the surgeon a good view of the area. The fibroid tumor is removed through the other incisions. The incisions are then closed with stitches (sutures) or staples.  Abdominal myomectomy--This method is used when the fibroid tumor cannot be removed with a hysteroscope or laparoscope. The surgery is performed through a larger surgical incision in the abdomen. The fibroid tumor is removed through this incision. The incision is closed with sutures or staples. AFTER THE PROCEDURE  If you had a laparoscopic or hysteroscopic myomectomy, you may be able to go home the same day, or  you may need to stay in the hospital overnight.  If you had an abdominal myomectomy, you may need to stay in the hospital for a few days.  Your IV access tube and catheter will be removed in 1-2 days.  You may be given medicine for pain or to help you sleep.  You may be given an antibiotic medicine, if needed.   This information is not intended to replace advice given to you by your health care provider. Make sure you discuss any questions you have with your health care provider.   Document Released: 10/09/2007 Document Revised: 10/02/2013 Document Reviewed: 07/24/2013 Elsevier Interactive Patient Education [employer].

## 2016-08-18 NOTE — Discharge Summary (Signed)
Physician Discharge Summary  Patient ID: Kristi Kirby MRN: [id number] DOB/AGE: [birth year] 30 y.o.  Admit date: 08/16/2016 Discharge date: 08/18/2016  Admission Diagnoses: symptomatic fibroid uterus  Discharge Diagnoses: s/p abdominal myomectomy Active Problems:   S/P myomectomy   Discharged Condition: good  Hospital Course: 30 yo G1P1 admitted for scheduled abdominal myomectomy for the management of her fibroid uterus. Patient had an uncomplicated surgery where a total of 4 fibroids were removed with a total weight of 352gm. Patient also had an uncomplicated post operative course. She remained afebrile, she tolerated a regular diet, voided, ambulated without difficulty and passed flatus prior to discharge. Discharge instructions were provided. Patient verbalized understanding and all questions were answered  Consults: None  Significant Diagnostic Studies: labs: pre-op Hg10--> post op Hg 8.8  Treatments: surgery: abdominal myomectomy  Discharge Exam: Blood pressure 118/68, pulse 88, temperature 98.2 F (36.8 C), temperature source Oral, resp. rate 18, height 5' (1.524 m), weight 170 lb (77.1 kg), last menstrual period 07/24/2016, SpO2 99 %. General appearance: alert, cooperative and no distress Resp: clear to auscultation bilaterally Cardio: regular rate and rhythm GI: soft, non-tender; bowel sounds normal; no masses,  no organomegaly Extremities: extremities normal, atraumatic, no cyanosis or edema and Homans sign is negative, no sign of DVT Incision/Wound: honeycomb dressing remains intact and clean  Disposition: 01-Home or Self Care      Signed: Donnica Jarnagin 08/18/2016, 8:07 AM

## 2016-08-23 ENCOUNTER — Encounter (HOSPITAL_COMMUNITY): Payer: Self-pay | Admitting: Obstetrics and Gynecology

## 2016-09-23 ENCOUNTER — Ambulatory Visit: Payer: Self-pay | Admitting: Obstetrics and Gynecology

## 2016-09-29 ENCOUNTER — Encounter: Payer: Self-pay | Admitting: Obstetrics and Gynecology

## 2016-09-29 ENCOUNTER — Ambulatory Visit (INDEPENDENT_AMBULATORY_CARE_PROVIDER_SITE_OTHER): Payer: Self-pay | Admitting: Obstetrics and Gynecology

## 2016-09-29 VITALS — BP 127/86 | HR 74 | Wt 172.9 lb

## 2016-09-29 DIAGNOSIS — Z9889 Other specified postprocedural states: Secondary | ICD-10-CM

## 2016-09-29 NOTE — Progress Notes (Signed)
30 yo G1P1 presenting today for post op check. Patient underwent a myomectomy on 8/22. She reports doing well since leaving the hospital. She returned to work a week following her surgery. She denies fevers, abnormal drainage from her incision or persistent abdominal pain  Past Medical History:  Diagnosis Date  . Anemia   . Fibroid   . SVD (spontaneous vaginal delivery)    x 1  . Vaginal Pap smear, abnormal    bx was ok   Past Surgical History:  Procedure Laterality Date  . MYOMECTOMY N/A 08/16/2016   Procedure: MYOMECTOMY;  Surgeon: Mora Bellman, MD;  Location: [facility];  Service: Gynecology;  Laterality: N/A;  . NO PAST SURGERIES     Family History  Problem Relation Age of Onset  . Diabetes Mother   . Diabetes Father   . Hypertension Father   . Heart disease Father    Social History  Substance Use Topics  . Smoking status: Never Smoker  . Smokeless tobacco: Never Used  . Alcohol use Yes     Comment: social liquor   ROS See pertinent in HPI  Blood pressure 127/86, pulse 74, weight 172 lb 14.4 oz (78.4 kg), last menstrual period 09/19/2016. GENERAL: Well-developed, well-nourished female in no acute distress.  ABDOMEN: Soft, nontender, nondistended. No organomegaly. Incision: no erythema, induration, or drainage EXTREMITIES: No cyanosis, clubbing, or edema, 2+ distal pulses.  A/P 30 yo here for post op check - Patient is medically cleared to resume all her daily activities - weight loss strategies discussed - RTC for annual exam or prn

## 2018-09-04 ENCOUNTER — Other Ambulatory Visit (HOSPITAL_COMMUNITY): Payer: Self-pay | Admitting: *Deleted

## 2018-09-04 ENCOUNTER — Encounter (HOSPITAL_COMMUNITY): Payer: Self-pay | Admitting: *Deleted

## 2018-09-04 DIAGNOSIS — N6310 Unspecified lump in the right breast, unspecified quadrant: Secondary | ICD-10-CM

## 2018-10-18 ENCOUNTER — Ambulatory Visit
Admission: RE | Admit: 2018-10-18 | Discharge: 2018-10-18 | Disposition: A | Discharge status: Home / Self Care | Payer: Self-pay | Source: Ambulatory Visit | Attending: Obstetrics and Gynecology | Admitting: Obstetrics and Gynecology

## 2018-10-18 ENCOUNTER — Encounter (HOSPITAL_COMMUNITY): Payer: Self-pay

## 2018-10-18 ENCOUNTER — Ambulatory Visit (HOSPITAL_COMMUNITY)
Admission: RE | Admit: 2018-10-18 | Discharge: 2018-10-18 | Disposition: A | Discharge status: Home / Self Care | Payer: Self-pay | Source: Ambulatory Visit | Attending: Obstetrics and Gynecology | Admitting: Obstetrics and Gynecology

## 2018-10-18 VITALS — BP 116/74 | Ht 60.0 in | Wt 168.0 lb

## 2018-10-18 DIAGNOSIS — N6310 Unspecified lump in the right breast, unspecified quadrant: Secondary | ICD-10-CM

## 2018-10-18 DIAGNOSIS — Z1239 Encounter for other screening for malignant neoplasm of breast: Secondary | ICD-10-CM

## 2018-10-18 DIAGNOSIS — N6313 Unspecified lump in the right breast, lower outer quadrant: Secondary | ICD-10-CM

## 2018-10-18 NOTE — Patient Instructions (Signed)
Explained breast self awareness with Heloise Purpura. Patient did not need a Pap smear today due to last Pap smear was 07/20/2018. Let her know [facility] will cover Pap smears every 3 years unless has a history of abnormal Pap smears. Referred patient to the [facility] [place] for a diagnostic mammogram and right breast ultrasound. Appointment scheduled for Thursday, October 18, 2018 at 1050. Patient aware of appointment and will be there. Heloise Purpura verbalized understanding.  Dedria Endres, Arvil Chaco, RN 8:24 AM

## 2018-10-18 NOTE — Progress Notes (Signed)
Patient referred to B[facility]CP by the [facility] Department due to palpating a right breast lump.  Pap Smear: Pap smear not completed today. Last Pap smear was 07/20/2018 at the [facility] Department and normal. Per patient has a history of an abnormal Pap smear 10 years ago that a colposcopy was completed for follow-up. Patient stated all Pap smears have been normal since colposcopy. Last Pap smear result is in Epic.  Physical exam: Breasts Breasts symmetrical. No skin abnormalities bilateral breasts. No nipple retraction bilateral breasts. No nipple discharge bilateral breasts. No lymphadenopathy. No lumps palpated left breast. Palpated a pea sized lump within the right breast at 8 o'clock 3 cm from the nipple. No complaints of pain or tenderness on exam. Referred patient to the [place] for a diagnostic mammogram and right breast ultrasound. Appointment scheduled for Thursday, October 18, 2018 at 1050.        Pelvic/Bimanual No Pap smear completed today since last Pap smear was 07/20/2018. Pap smear not indicated per BCCCP guidelines.   Smoking History: Patient has never smoked.  Patient Navigation: Patient education provided. Access to services provided for patient through BCCCP program.   Breast and Cervical Cancer Risk Assessment: Patient has no family history of breast cancer, known genetic mutations, or radiation treatment to the chest before age 46. Per patient has a history of cervical dysplasia. Patient has no history of being immunocompromised or DES exposure in-utero. Breast cancer risk assessment completed. Unable to calculate breast cancer risk due to patient is less than 73 years old.

## 2018-10-26 ENCOUNTER — Encounter (HOSPITAL_COMMUNITY): Payer: Self-pay | Admitting: *Deleted

## 2019-07-05 ENCOUNTER — Ambulatory Visit (HOSPITAL_COMMUNITY)
Admission: EM | Admit: 2019-07-05 | Discharge: 2019-07-05 | Disposition: A | Discharge status: Home / Self Care | Payer: Medicaid Other

## 2019-07-05 ENCOUNTER — Other Ambulatory Visit: Payer: Self-pay

## 2019-07-09 ENCOUNTER — Encounter: Payer: Self-pay | Admitting: *Deleted

## 2019-07-09 ENCOUNTER — Other Ambulatory Visit: Payer: Self-pay

## 2019-07-09 ENCOUNTER — Emergency Department
Admission: EM | Admit: 2019-07-09 | Discharge: 2019-07-09 | Disposition: A | Discharge status: Home / Self Care | Payer: Medicaid Other | Attending: Emergency Medicine | Admitting: Emergency Medicine

## 2019-07-09 DIAGNOSIS — R519 Headache, unspecified: Secondary | ICD-10-CM

## 2019-07-09 DIAGNOSIS — N390 Urinary tract infection, site not specified: Secondary | ICD-10-CM

## 2019-07-09 DIAGNOSIS — R51 Headache: Secondary | ICD-10-CM | POA: Insufficient documentation

## 2019-07-09 DIAGNOSIS — J111 Influenza due to unidentified influenza virus with other respiratory manifestations: Secondary | ICD-10-CM | POA: Insufficient documentation

## 2019-07-09 DIAGNOSIS — Z79899 Other long term (current) drug therapy: Secondary | ICD-10-CM | POA: Insufficient documentation

## 2019-07-09 LAB — URINALYSIS, COMPLETE (UACMP) WITH MICROSCOPIC
Bacteria, UA: NONE SEEN
Bilirubin Urine: NEGATIVE
Glucose, UA: NEGATIVE mg/dL
Hgb urine dipstick: NEGATIVE
Ketones, ur: 20 mg/dL — AB
Nitrite: NEGATIVE
Protein, ur: 100 mg/dL — AB
Specific Gravity, Urine: 1.034 — ABNORMAL HIGH (ref 1.005–1.030)
WBC, UA: >50 WBC/hpf — ABNORMAL HIGH (ref 0–5)
pH: 5 (ref 5.0–8.0)

## 2019-07-09 LAB — POCT PREGNANCY, URINE: Preg Test, Ur: NEGATIVE

## 2019-07-09 MED ORDER — PROCHLORPERAZINE EDISYLATE 10 MG/2ML IJ SOLN
10.0000 mg | Freq: Once | INTRAMUSCULAR | Status: AC
Start: 2019-07-09 — End: 2019-07-09
  Administered 2019-07-09: 10 mg via INTRAVENOUS
  Filled 2019-07-09: qty 2

## 2019-07-09 MED ORDER — NITROFURANTOIN MONOHYD MACRO 100 MG PO CAPS: 100.0000 mg | ORAL_CAPSULE | Freq: Two times a day (BID) | ORAL | 0 refills | Status: AC

## 2019-07-09 MED ORDER — SODIUM CHLORIDE 0.9 % IV BOLUS
1000.0000 mL | Freq: Once | INTRAVENOUS | Status: AC
  Administered 2019-07-09: 21:00:00 1000 mL via INTRAVENOUS

## 2019-07-09 MED ORDER — ACETAMINOPHEN 500 MG PO TABS
1000.0000 mg | ORAL_TABLET | Freq: Once | ORAL | Status: AC
  Administered 2019-07-09: 16:00:00 1000 mg via ORAL

## 2019-07-09 MED ORDER — KETOROLAC TROMETHAMINE 30 MG/ML IJ SOLN
30.0000 mg | Freq: Once | INTRAMUSCULAR | Status: AC
  Administered 2019-07-09: 30 mg via INTRAVENOUS
  Filled 2019-07-09: qty 1

## 2019-07-09 MED ORDER — ACETAMINOPHEN 500 MG PO TABS
ORAL_TABLET | ORAL | Status: AC
  Administered 2019-07-09: 1000 mg via ORAL
  Filled 2019-07-09: qty 2

## 2019-07-09 NOTE — ED Triage Notes (Signed)
Pt to ED reporting headache, generalized body aches, chills. 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 Pt had not checked her temp at home but is febrile in ED at 102.9.

## 2019-07-09 NOTE — Discharge Instructions (Signed)
Please seek medical attention for any high fevers, chest pain, shortness of breath, change in behavior, persistent vomiting, bloody stool or any other new or concerning symptoms.  

## 2019-07-09 NOTE — ED Provider Notes (Signed)
[facility] Emergency Department Provider Note  ____________________________________________   I have reviewed the triage vital signs and the nursing notes.   HISTORY  Chief Complaint Headache and Generalized Body Aches   History limited by: Not Limited   HPI Kristi Kirby is a 33 y.o. female who presents to the emergency department today because of concern for headaches, chills and body aches. Patient states symptoms have been present for the past week. The patient denies any known covid exposure although states she was seen at urgent care and had a covid test sent a few days ago. Still awaiting those results. The patient did have measured fever earlier today.   Records reviewed. Per medical record review patient has a history of anemia  Past Medical History:  Diagnosis Date  . Anemia   . Fibroid   . SVD (spontaneous vaginal delivery)    x 1  . Vaginal Pap smear, abnormal    bx was ok    Patient Active Problem List   Diagnosis Date Noted  . S/P myomectomy 08/16/2016  . Fibroid uterus 06/16/2016    Past Surgical History:  Procedure Laterality Date  . MYOMECTOMY N/A 08/16/2016   Procedure: MYOMECTOMY;  Surgeon: Mora Bellman, MD;  Location: [facility];  Service: Gynecology;  Laterality: N/A;  . NO PAST SURGERIES      Prior to Admission medications   Medication Sig Start Date End Date Taking? Authorizing Provider  amoxicillin (AMOXIL) 500 MG capsule Take 500 mg by mouth 2 (two) times daily.   Yes [provider]  ELDERBERRY PO Take 1 capsule by mouth daily.   Yes [provider]  Pseudoephedrine-APAP-DM (DAYQUIL PO) Take by mouth daily.   Yes [provider]    Allergies Patient has no known allergies.  Family History  Problem Relation Age of Onset  . Diabetes Mother   . Diabetes Father   . Hypertension Father   . Heart disease Father     Social History Social History   Tobacco Use  . Smoking status: Never  Smoker  . Smokeless tobacco: Never Used  Substance Use Topics  . Alcohol use: Yes    Comment: social liquor  . Drug use: No    Review of Systems Constitutional: Positive for fever and chills.  Eyes: No visual changes. ENT: No sore throat. Cardiovascular: Denies chest pain. Respiratory: Denies shortness of breath. Gastrointestinal: No abdominal pain.  No nausea, no vomiting.  No diarrhea.   Genitourinary: Negative for dysuria. Musculoskeletal: Positive for body aches.  Skin: Negative for rash. Neurological: Positive for headache.  ____________________________________________   PHYSICAL EXAM:  VITAL SIGNS: ED Triage Vitals [07/09/19 1523]  Enc Vitals Group     BP 135/88     Pulse Rate (!) 118     Resp 16     Temp (!) 102.9 F (39.4 C)     Temp Source Oral     SpO2 98 %     Weight 170 lb (77.1 kg)     Height 5' (1.524 m)     Head Circumference      Peak Flow      Pain Score 8   Constitutional: Alert and oriented.  Eyes: Conjunctivae are normal.  ENT      Head: Normocephalic and atraumatic.      Nose: No congestion/rhinnorhea.      Mouth/Throat: Mucous membranes are moist.      Neck: No stridor. Hematological/Lymphatic/Immunilogical: No cervical lymphadenopathy. Cardiovascular: Normal rate, regular rhythm.  No murmurs, rubs, or gallops.  Respiratory: Normal respiratory effort without tachypnea nor retractions. Breath sounds are clear and equal bilaterally. No wheezes/rales/rhonchi. Gastrointestinal: Soft and non tender. No rebound. No guarding.  Genitourinary: Deferred Musculoskeletal: Normal range of motion in all extremities. No lower extremity edema. Neurologic:  Normal speech and language. No gross focal neurologic deficits are appreciated.  Skin:  Skin is warm, dry and intact. No rash noted. Psychiatric: Mood and affect are normal. Speech and behavior are normal. Patient exhibits appropriate insight and  judgment.  ____________________________________________    LABS (pertinent positives/negatives)  Upreg negative UA cloudy, moderate leukocytes, 6-10 rbc, >50 wbc  ____________________________________________   EKG  None  ____________________________________________    RADIOLOGY  None  ____________________________________________   PROCEDURES  Procedures  ____________________________________________   INITIAL IMPRESSION / ASSESSMENT AND PLAN / ED COURSE  Pertinent labs & imaging results that were available during my care of the patient were reviewed by me and considered in my medical decision making (see chart for details).   Patient presented to the emergency department today with concerns for bad headache as well as symptoms concerning for viral illness.  She had been tested for COVID has not got those results yet.  She was given IV fluids and medications here with some improvement of her headache.  She did go to a point where she felt comfortable going home.  Did discuss urine results with the patient and I do have concern for urinary tract infection.  Will send in antibiotics for the patient.   ____________________________________________   FINAL CLINICAL IMPRESSION(S) / ED DIAGNOSES  Final diagnoses:  Bad headache  Lower urinary tract infection  Influenza-like illness     Note: This dictation was prepared with Dragon dictation. Any transcriptional errors that result from this process are unintentional     Nance Pear, MD 07/09/19 2255

## 2019-07-09 NOTE — ED Triage Notes (Signed)
First Nurse NOte:  Arrives from [facility] for evaluation of fever and tachycardia.  Per [facility] report, patient tested positive for COVID and has not been medicated for fever.  Patient is AAOx3.  No SOB/ DOE.  NAD

## 2024-01-15 ENCOUNTER — Other Ambulatory Visit: Payer: Self-pay

## 2024-01-15 ENCOUNTER — Emergency Department
Admission: EM | Admit: 2024-01-15 | Discharge: 2024-01-15 | Disposition: A | Discharge status: Home / Self Care | Payer: Self-pay | Attending: Emergency Medicine | Admitting: Emergency Medicine

## 2024-01-15 ENCOUNTER — Telehealth: Payer: Self-pay

## 2024-01-15 DIAGNOSIS — R519 Headache, unspecified: Secondary | ICD-10-CM | POA: Insufficient documentation

## 2024-01-15 MED ORDER — PROCHLORPERAZINE EDISYLATE 10 MG/2ML IJ SOLN
10.0000 mg | Freq: Once | INTRAMUSCULAR | Status: AC
  Administered 2024-01-15: 10 mg via INTRAVENOUS
  Filled 2024-01-15: qty 2

## 2024-01-15 MED ORDER — DIPHENHYDRAMINE HCL 50 MG/ML IJ SOLN
25.0000 mg | Freq: Once | INTRAMUSCULAR | Status: AC
  Administered 2024-01-15: 25 mg via INTRAVENOUS
  Filled 2024-01-15: qty 1

## 2024-01-15 NOTE — ED Notes (Signed)
Urine specimen sent to lab

## 2024-01-15 NOTE — Telephone Encounter (Signed)
Telephoned patient at mobile number. Left a voice message with BCCCP (referral) contact information. 

## 2024-01-15 NOTE — ED Provider Notes (Signed)
[facility] Provider Note    Event Date/Time   First MD Initiated Contact with Patient 01/15/24 [id number]     (approximate)   History   Chief Complaint Headache   HPI  Kristi Kirby is a 38 y.o. female with past medical history of anemia who presents to the ED complaining of headache.  Patient reports that she has had waxing and waning headache for the past 3 days that does not seem to be exacerbated or alleviated by anything in particular.  She describes aching pain around both of her temples, denies any fevers, nausea, vomiting, vision changes, speech changes, numbness, or weakness.  She has been taking Tylenol and ibuprofen at home without significant relief.     Physical Exam   Triage Vital Signs: ED Triage Vitals  Encounter Vitals Group     BP 01/15/24 0231 (!) 136/95     Systolic BP Percentile --      Diastolic BP Percentile --      Pulse Rate 01/15/24 0231 82     Resp 01/15/24 0231 16     Temp 01/15/24 0231 98.1 F (36.7 C)     Temp Source 01/15/24 0231 Oral     SpO2 01/15/24 0231 100 %     Weight 01/15/24 0230 151 lb (68.5 kg)     Height 01/15/24 0230 5' (1.524 m)     Head Circumference --      Peak Flow --      Pain Score 01/15/24 0230 5     Pain Loc --      Pain Education --      Exclude from Growth Chart --     Most recent vital signs: Vitals:   01/15/24 0231 01/15/24 0557  BP: (!) 136/95 131/89  Pulse: 82 87  Resp: 16 18  Temp: 98.1 F (36.7 C)   SpO2: 100% 100%    Constitutional: Alert and oriented. Eyes: Conjunctivae are normal.  Pupils equal, round, and reactive to light bilaterally. Head: Atraumatic. Nose: No congestion/rhinnorhea. Mouth/Throat: Mucous membranes are moist.  Neck: Supple with no meningismus. Cardiovascular: Normal rate, regular rhythm. Grossly normal heart sounds.  2+ radial pulses bilaterally. Respiratory: Normal respiratory effort.  No retractions. Lungs CTAB. Gastrointestinal: Soft and nontender.  No distention. Musculoskeletal: No lower extremity tenderness nor edema.  Neurologic:  Normal speech and language. No gross focal neurologic deficits are appreciated.    ED Results / Procedures / Treatments   Labs (all labs ordered are listed, but only abnormal results are displayed) Labs Reviewed - No data to display   PROCEDURES:  Critical Care performed: No  Procedures   MEDICATIONS ORDERED IN ED: Medications  prochlorperazine (COMPAZINE) injection 10 mg (10 mg Intravenous Given 01/15/24 0554)  diphenhydrAMINE (BENADRYL) injection 25 mg (25 mg Intravenous Given 01/15/24 0553)  diphenhydrAMINE (BENADRYL) injection 25 mg (25 mg Intravenous Given 01/15/24 9604)     IMPRESSION / MDM / ASSESSMENT AND PLAN / ED COURSE  I reviewed the triage vital signs and the nursing notes.                              38 y.o. female with past medical history of anemia who presents to the ED complaining of waxing waning headache for the past 3 days.  Patient's presentation is most consistent with acute, uncomplicated illness.  Differential diagnosis includes, but is not limited to, tension headache, migraine headache, SAH, meningitis.  Patient well-appearing and in no acute distress, vital signs are unremarkable.  She describes a gradually worsening headache and has no focal neurologic deficits on exam.  No features concerning for meningitis or SAH and do not feel imaging is warranted at this time.  Suspect migraine headache versus tension headache, will treat with IV Compazine and Benadryl.  Patient with some akathisia following Compazine dose, now improved with additional IV Benadryl.  She reports that headache is improved and she is appropriate for discharge home with outpatient follow-up, was counseled to return to the ED for new or worsening symptoms.  Patient agrees with plan.      FINAL CLINICAL IMPRESSION(S) / ED DIAGNOSES   Final diagnoses:  Acute nonintractable headache,  unspecified headache type     Rx / DC Orders   ED Discharge Orders     None        Note:  This document was prepared using Dragon voice recognition software and may include unintentional dictation errors.   Chesley Noon, MD 01/15/24 [phone]

## 2024-01-15 NOTE — ED Triage Notes (Signed)
Pt reports headache for the past few days that she hasn't been able to get rid of. Pt states she also has a lump on her left side that she has had for awhile she wants to get looked at. Pt denies vision changes, numbness or weakness.

## 2024-02-15 ENCOUNTER — Ambulatory Visit: Payer: Medicaid Other | Admitting: *Deleted

## 2024-02-15 VITALS — BP 133/79 | Wt 150.0 lb

## 2024-02-15 DIAGNOSIS — Z1239 Encounter for other screening for malignant neoplasm of breast: Secondary | ICD-10-CM

## 2024-02-15 DIAGNOSIS — R87610 Atypical squamous cells of undetermined significance on cytologic smear of cervix (ASC-US): Secondary | ICD-10-CM

## 2024-02-15 NOTE — Progress Notes (Signed)
 Ms. Kristi Kirby is a 38 y.o. female who presents to [facility] clinic today with no complaints. Patient referred to B[facility]CP by the [facility] Department due to having an abnormal Pap smear 12/29/2023 that a colposcopy is recommended for follow up.   Pap Smear: Pap smear not completed today. Last Pap smear was 12/29/2023 at the [facility] clinic and was abnormal - ASCUS with positive HPV . Per patient has history of an abnormal Pap smear in 2017 that was ASCUS that a colposcopy was completed for follow up. Last Pap smear result is available in Epic.   Physical exam: Breasts Breasts symmetrical. No skin abnormalities bilateral breasts. No nipple retraction bilateral breasts. No nipple discharge bilateral breasts. No lymphadenopathy. No lumps palpated bilateral breasts. No complaints of pain or tenderness on exam. Screening mammogram recommended at age 43 unless clinically indicated prior.       Pelvic/Bimanual Pap is not indicated today per BCCCP guidelines.   Smoking History: Patient has never smoked.   Patient Navigation: Patient education provided. Access to services provided for patient through BCCCP program.   Breast and Cervical Cancer Risk Assessment: Patient does not have family history of breast cancer, known genetic mutations, or radiation treatment to the chest before age 38. Patient does not have history of cervical dysplasia, immunocompromised, or DES exposure in-utero.  Risk Scores as of Encounter on 02/15/2024     Kristi Kirby           5-year 0.42%   Lifetime 9.75%            Last calculated by Kristi Kirby, CMA on 02/15/2024 at 12:02 PM       A: BCCCP exam without pap smear No complaints.  P: Referred patient to the [facility] for [facility] for a colposcopy to follow up for her abnormal Pap smear. Appointment scheduled Thursday, February 22, 2024 at [phone].  Priscille Heidelberg, RN 02/15/2024 11:57 AM

## 2024-02-15 NOTE — Patient Instructions (Signed)
 Explained breast self awareness with Jack Quarto. Patient did not need a Pap smear today due to last Pap smear was 12/29/2023. Referred patient to the [facility] for [facility] for a colposcopy to follow up for her abnormal Pap smear. Appointment scheduled Thursday, February 22, 2024 at [phone]. Patient aware of appointment and will be there. Let patient know a screening mammogram is recommended at age 38 unless clinically indicated prior. Jack Quarto verbalized understanding.  Addison Whidbee, Kathaleen Maser, RN 11:57 AM

## 2024-02-22 ENCOUNTER — Ambulatory Visit (INDEPENDENT_AMBULATORY_CARE_PROVIDER_SITE_OTHER): Payer: Self-pay | Admitting: Obstetrics and Gynecology

## 2024-02-22 ENCOUNTER — Encounter: Payer: Self-pay | Admitting: Obstetrics and Gynecology

## 2024-02-22 ENCOUNTER — Other Ambulatory Visit (HOSPITAL_COMMUNITY)
Admission: RE | Admit: 2024-02-22 | Discharge: 2024-02-22 | Disposition: A | Discharge status: Home / Self Care | Source: Ambulatory Visit | Attending: Obstetrics and Gynecology | Admitting: Obstetrics and Gynecology

## 2024-02-22 ENCOUNTER — Other Ambulatory Visit: Payer: Self-pay

## 2024-02-22 VITALS — BP 128/91 | HR 83 | Ht 60.0 in | Wt 150.8 lb

## 2024-02-22 DIAGNOSIS — R87810 Cervical high risk human papillomavirus (HPV) DNA test positive: Secondary | ICD-10-CM | POA: Diagnosis present

## 2024-02-22 DIAGNOSIS — R87610 Atypical squamous cells of undetermined significance on cytologic smear of cervix (ASC-US): Secondary | ICD-10-CM | POA: Insufficient documentation

## 2024-02-22 DIAGNOSIS — Z3202 Encounter for pregnancy test, result negative: Secondary | ICD-10-CM

## 2024-02-22 LAB — POCT PREGNANCY, URINE: Preg Test, Ur: NEGATIVE

## 2024-02-22 NOTE — Progress Notes (Signed)
    GYNECOLOGY CLINIC COLPOSCOPY PROCEDURE NOTE  38 y.o. [id number] here for colposcopy for pap finding of:  Result Date Procedure Results Follow-ups  12/29/2023 Cytology - PAP    07/20/2018 Cytology - PAP    ASCUS pap with pos HPV  Discussed role for HPV in cervical dysplasia, need for surveillance, nature of the procedure, and risks and benefits.  Pregnancy test: Lab Results  Component Value Date   PREGTESTUR NEGATIVE 02/22/2024    No Known Allergies  Patient given informed consent, signed copy in the chart, time out was performed.    Placed in lithotomy position. Cervix viewed with speculum and colposcope after application of lugol's iodine.   Colposcopy Adequacy Cervix fully visualized: Yes  SCJ fully visualized: Yes  Colposcopy Findings  acetowhite lesion(s) noted at a band from 10-3 o'clock and at 5 o'clock  Corresponding biopsies were obtained.    ECC specimen was obtained.  All specimens were labeled and sent to pathology.  Hemostatic measures: Monsel's solution  Complications: none  Patient tolerated the procedure well.  OBGyn Exam  Colposcopy Impressions Low grade features  Plan Treatment plan pending biopsy results, per patient preference they will be communicated by phone call or mychart.  Patient was given post procedure instructions.  Will follow up pathology and manage accordingly; patient will be contacted with results and recommendations.  Routine preventative health maintenance measures emphasized.   Warden Fillers, MD

## 2024-02-22 NOTE — Progress Notes (Signed)
 Marland Kitchen

## 2024-02-26 LAB — SURGICAL PATHOLOGY

## 2024-03-01 ENCOUNTER — Telehealth: Payer: Self-pay

## 2024-03-01 NOTE — Telephone Encounter (Addendum)
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 Pap recall set. Advised pt to call in January 2026 for appt.
